# Patient Record
Sex: Female | Born: 1960 | Race: White | Hispanic: No | Marital: Married | Smoking: Never smoker
Health system: Southern US, Community
[De-identification: ages and names within clinical notes are randomized; demographics above are authoritative.]

## PROBLEM LIST (undated history)

## (undated) DIAGNOSIS — J189 Pneumonia, unspecified organism: Secondary | ICD-10-CM

## (undated) DIAGNOSIS — K219 Gastro-esophageal reflux disease without esophagitis: Secondary | ICD-10-CM

## (undated) DIAGNOSIS — Z9889 Other specified postprocedural states: Secondary | ICD-10-CM

## (undated) DIAGNOSIS — I10 Essential (primary) hypertension: Secondary | ICD-10-CM

## (undated) DIAGNOSIS — R112 Nausea with vomiting, unspecified: Secondary | ICD-10-CM

## (undated) DIAGNOSIS — E559 Vitamin D deficiency, unspecified: Secondary | ICD-10-CM

## (undated) DIAGNOSIS — R531 Weakness: Secondary | ICD-10-CM

## (undated) HISTORY — PX: ABDOMINAL HYSTERECTOMY: SHX81

## (undated) HISTORY — PX: COLONOSCOPY: SHX174

---

## 1998-01-13 ENCOUNTER — Ambulatory Visit (HOSPITAL_COMMUNITY): Admission: RE | Admit: 1998-01-13 | Discharge: 1998-01-13 | Payer: Self-pay | Admitting: Internal Medicine

## 1998-03-31 ENCOUNTER — Inpatient Hospital Stay (HOSPITAL_COMMUNITY): Admission: RE | Admit: 1998-03-31 | Discharge: 1998-04-02 | Payer: Self-pay | Admitting: Obstetrics and Gynecology

## 1998-11-21 HISTORY — PX: BREAST EXCISIONAL BIOPSY: SUR124

## 1999-03-03 ENCOUNTER — Other Ambulatory Visit: Admission: RE | Admit: 1999-03-03 | Discharge: 1999-03-03 | Payer: Self-pay | Admitting: Obstetrics and Gynecology

## 1999-03-11 ENCOUNTER — Encounter: Payer: Self-pay | Admitting: Obstetrics and Gynecology

## 1999-03-11 ENCOUNTER — Ambulatory Visit (HOSPITAL_COMMUNITY): Admission: RE | Admit: 1999-03-11 | Discharge: 1999-03-11 | Payer: Self-pay | Admitting: Obstetrics and Gynecology

## 1999-03-30 ENCOUNTER — Ambulatory Visit (HOSPITAL_COMMUNITY): Admission: RE | Admit: 1999-03-30 | Discharge: 1999-03-30 | Payer: Self-pay | Admitting: *Deleted

## 1999-06-14 ENCOUNTER — Encounter (INDEPENDENT_AMBULATORY_CARE_PROVIDER_SITE_OTHER): Payer: Self-pay | Admitting: Specialist

## 1999-06-14 ENCOUNTER — Other Ambulatory Visit: Admission: RE | Admit: 1999-06-14 | Discharge: 1999-06-14 | Payer: Self-pay | Admitting: Obstetrics and Gynecology

## 1999-08-23 ENCOUNTER — Other Ambulatory Visit: Admission: RE | Admit: 1999-08-23 | Discharge: 1999-08-23 | Payer: Self-pay | Admitting: Obstetrics and Gynecology

## 2000-04-18 ENCOUNTER — Other Ambulatory Visit: Admission: RE | Admit: 2000-04-18 | Discharge: 2000-04-18 | Payer: Self-pay | Admitting: Obstetrics and Gynecology

## 2000-08-24 ENCOUNTER — Other Ambulatory Visit: Admission: RE | Admit: 2000-08-24 | Discharge: 2000-08-24 | Payer: Self-pay | Admitting: Obstetrics and Gynecology

## 2001-02-27 ENCOUNTER — Ambulatory Visit (HOSPITAL_COMMUNITY): Admission: RE | Admit: 2001-02-27 | Discharge: 2001-02-27 | Payer: Self-pay | Admitting: Internal Medicine

## 2001-02-27 ENCOUNTER — Encounter: Payer: Self-pay | Admitting: Internal Medicine

## 2001-07-12 ENCOUNTER — Other Ambulatory Visit: Admission: RE | Admit: 2001-07-12 | Discharge: 2001-07-12 | Payer: Self-pay | Admitting: Obstetrics and Gynecology

## 2002-03-29 ENCOUNTER — Ambulatory Visit (HOSPITAL_COMMUNITY): Admission: RE | Admit: 2002-03-29 | Discharge: 2002-03-29 | Payer: Self-pay | Admitting: Internal Medicine

## 2002-07-18 ENCOUNTER — Other Ambulatory Visit: Admission: RE | Admit: 2002-07-18 | Discharge: 2002-07-18 | Payer: Self-pay | Admitting: Obstetrics and Gynecology

## 2003-05-12 ENCOUNTER — Encounter: Payer: Self-pay | Admitting: Internal Medicine

## 2003-05-12 ENCOUNTER — Ambulatory Visit (HOSPITAL_COMMUNITY): Admission: RE | Admit: 2003-05-12 | Discharge: 2003-05-12 | Payer: Self-pay | Admitting: Internal Medicine

## 2003-08-21 ENCOUNTER — Other Ambulatory Visit: Admission: RE | Admit: 2003-08-21 | Discharge: 2003-08-21 | Payer: Self-pay | Admitting: Obstetrics and Gynecology

## 2004-01-28 ENCOUNTER — Ambulatory Visit (HOSPITAL_BASED_OUTPATIENT_CLINIC_OR_DEPARTMENT_OTHER): Admission: RE | Admit: 2004-01-28 | Discharge: 2004-01-28 | Payer: Self-pay | Admitting: *Deleted

## 2004-01-28 ENCOUNTER — Ambulatory Visit (HOSPITAL_COMMUNITY): Admission: RE | Admit: 2004-01-28 | Discharge: 2004-01-28 | Payer: Self-pay | Admitting: *Deleted

## 2004-01-28 ENCOUNTER — Encounter (INDEPENDENT_AMBULATORY_CARE_PROVIDER_SITE_OTHER): Payer: Self-pay | Admitting: Specialist

## 2004-05-20 ENCOUNTER — Ambulatory Visit (HOSPITAL_COMMUNITY): Admission: RE | Admit: 2004-05-20 | Discharge: 2004-05-20 | Payer: Self-pay | Admitting: Internal Medicine

## 2004-09-07 ENCOUNTER — Other Ambulatory Visit: Admission: RE | Admit: 2004-09-07 | Discharge: 2004-09-07 | Payer: Self-pay | Admitting: Obstetrics and Gynecology

## 2005-09-22 ENCOUNTER — Other Ambulatory Visit: Admission: RE | Admit: 2005-09-22 | Discharge: 2005-09-22 | Payer: Self-pay | Admitting: Obstetrics and Gynecology

## 2005-10-09 ENCOUNTER — Encounter: Admission: RE | Admit: 2005-10-09 | Discharge: 2005-10-09 | Payer: Self-pay | Admitting: Internal Medicine

## 2006-02-27 ENCOUNTER — Ambulatory Visit (HOSPITAL_COMMUNITY): Admission: RE | Admit: 2006-02-27 | Discharge: 2006-02-27 | Payer: Self-pay | Admitting: Neurosurgery

## 2006-06-01 ENCOUNTER — Encounter: Admission: RE | Admit: 2006-06-01 | Discharge: 2006-06-01 | Payer: Self-pay | Admitting: Internal Medicine

## 2006-06-30 ENCOUNTER — Ambulatory Visit (HOSPITAL_COMMUNITY): Admission: RE | Admit: 2006-06-30 | Discharge: 2006-06-30 | Payer: Self-pay | Admitting: Internal Medicine

## 2006-09-25 ENCOUNTER — Other Ambulatory Visit: Admission: RE | Admit: 2006-09-25 | Discharge: 2006-09-25 | Payer: Self-pay | Admitting: Obstetrics and Gynecology

## 2007-07-06 ENCOUNTER — Ambulatory Visit (HOSPITAL_COMMUNITY): Admission: RE | Admit: 2007-07-06 | Discharge: 2007-07-06 | Payer: Self-pay | Admitting: Internal Medicine

## 2007-09-27 ENCOUNTER — Other Ambulatory Visit: Admission: RE | Admit: 2007-09-27 | Discharge: 2007-09-27 | Payer: Self-pay | Admitting: Obstetrics and Gynecology

## 2007-11-22 HISTORY — PX: GANGLION CYST EXCISION: SHX1691

## 2008-10-02 ENCOUNTER — Other Ambulatory Visit: Admission: RE | Admit: 2008-10-02 | Discharge: 2008-10-02 | Payer: Self-pay | Admitting: Obstetrics and Gynecology

## 2009-04-21 HISTORY — PX: OTHER SURGICAL HISTORY: SHX169

## 2009-04-28 ENCOUNTER — Ambulatory Visit (HOSPITAL_COMMUNITY): Admission: RE | Admit: 2009-04-28 | Discharge: 2009-04-28 | Payer: Self-pay | Admitting: Internal Medicine

## 2009-08-30 ENCOUNTER — Encounter: Admission: RE | Admit: 2009-08-30 | Discharge: 2009-08-30 | Payer: Self-pay | Admitting: Orthopedic Surgery

## 2009-10-06 ENCOUNTER — Other Ambulatory Visit: Admission: RE | Admit: 2009-10-06 | Discharge: 2009-10-06 | Payer: Self-pay | Admitting: Obstetrics and Gynecology

## 2010-05-17 ENCOUNTER — Ambulatory Visit (HOSPITAL_COMMUNITY): Admission: RE | Admit: 2010-05-17 | Discharge: 2010-05-17 | Payer: Self-pay | Admitting: Internal Medicine

## 2010-10-11 ENCOUNTER — Other Ambulatory Visit: Admission: RE | Admit: 2010-10-11 | Discharge: 2010-10-11 | Payer: Self-pay | Admitting: Obstetrics and Gynecology

## 2011-04-08 NOTE — Op Note (Signed)
NAME:  Paula Chase, Paula Chase                         ACCOUNT NO.:  [id number]   MEDICAL RECORD NO.:  [id number]                   PATIENT TYPE:  AMB   LOCATION:  DSC                                  FACILITY:  [facility]   PHYSICIAN:  Vikki Ports, M.D.         DATE OF BIRTH:  1961/02/14   DATE OF PROCEDURE:  01/28/2004  DATE OF DISCHARGE:                                 OPERATIVE REPORT   PREOPERATIVE DIAGNOSES:  Left breast mass.   POSTOPERATIVE DIAGNOSES:  Left breast mass.   OPERATION PERFORMED:  Excisional left breast biopsy.   SURGEON:  Vikki Ports, M.D.   ANESTHESIA:  MAC.   DESCRIPTION OF PROCEDURE:  The patient was taken to the operating room and  placed in supine position.  After adequate  anesthesia was induced, the left  breast was prepped and draped in the normal sterile fashion.  Using 1%  lidocaine with epinephrine, the skin and subcutaneous tissue overlying the  periareolar region of the left breast was anesthetized.  An incision was  made, a very superficial fibrous mass was identified and incised in its  entirety using Bovie electrocautery.  Adequate hemostasis was ensured and  the skin was closed with a subcuticular 4-0 Monocryl.  Steri-Strips and  sterile dressings were applied.  The patient was taken to PACU in good  condition.                                               Vikki Ports, M.D.    KR[facility]/MEDQ  D:  01/28/2004  T:  01/29/2004  Job:  [id number]

## 2011-08-11 ENCOUNTER — Other Ambulatory Visit: Payer: Self-pay | Admitting: Orthopedic Surgery

## 2011-08-11 DIAGNOSIS — M542 Cervicalgia: Secondary | ICD-10-CM

## 2011-08-18 ENCOUNTER — Ambulatory Visit
Admission: RE | Admit: 2011-08-18 | Discharge: 2011-08-18 | Disposition: A | Discharge status: Home / Self Care | Payer: 59 | Source: Ambulatory Visit | Attending: Orthopedic Surgery | Admitting: Orthopedic Surgery

## 2011-08-18 DIAGNOSIS — M542 Cervicalgia: Secondary | ICD-10-CM

## 2011-10-14 ENCOUNTER — Other Ambulatory Visit (HOSPITAL_COMMUNITY): Payer: Self-pay | Admitting: Internal Medicine

## 2011-10-14 DIAGNOSIS — Z1231 Encounter for screening mammogram for malignant neoplasm of breast: Secondary | ICD-10-CM

## 2011-11-01 ENCOUNTER — Encounter (HOSPITAL_COMMUNITY): Payer: Self-pay

## 2011-11-02 ENCOUNTER — Ambulatory Visit (HOSPITAL_COMMUNITY)
Admission: RE | Admit: 2011-11-02 | Discharge: 2011-11-02 | Disposition: A | Discharge status: Home / Self Care | Payer: 59 | Source: Ambulatory Visit | Attending: Internal Medicine | Admitting: Internal Medicine

## 2011-11-02 DIAGNOSIS — Z1231 Encounter for screening mammogram for malignant neoplasm of breast: Secondary | ICD-10-CM

## 2011-11-09 ENCOUNTER — Encounter (HOSPITAL_COMMUNITY)
Admission: RE | Admit: 2011-11-09 | Discharge: 2011-11-09 | Disposition: A | Discharge status: Home / Self Care | Payer: 59 | Source: Ambulatory Visit | Attending: Orthopedic Surgery | Admitting: Orthopedic Surgery

## 2011-11-09 ENCOUNTER — Encounter (HOSPITAL_COMMUNITY): Payer: Self-pay

## 2011-11-09 ENCOUNTER — Other Ambulatory Visit: Payer: Self-pay

## 2011-11-09 HISTORY — DX: Gastro-esophageal reflux disease without esophagitis: K21.9

## 2011-11-09 HISTORY — DX: Other specified postprocedural states: Z98.890

## 2011-11-09 HISTORY — DX: Nausea with vomiting, unspecified: R11.2

## 2011-11-09 LAB — SURGICAL PCR SCREEN
MRSA, PCR: NEGATIVE
Staphylococcus aureus: NEGATIVE

## 2011-11-09 LAB — BASIC METABOLIC PANEL
BUN: 17 mg/dL (ref 6–23)
Calcium: 9.6 mg/dL (ref 8.4–10.5)
GFR calc Af Amer: >90 mL/min (ref >90)
GFR calc non Af Amer: >90 mL/min (ref >90)
Potassium: 3.6 mEq/L (ref 3.5–5.1)

## 2011-11-09 LAB — CBC
Hemoglobin: 11.7 g/dL — ABNORMAL LOW (ref 12.0–15.0)
MCHC: 32.4 g/dL (ref 30.0–36.0)
Platelets: 273 10*3/uL (ref 150–400)
RDW: 14.7 % (ref 11.5–15.5)

## 2011-11-09 NOTE — Pre-Procedure Instructions (Signed)
[name]  11/09/2011   Your procedure is scheduled on:  11/17/11  Report to [facility] at 530 AM.  Call this number if you have problems the morning of surgery: [phone]   Remember:   Do not eat food:After Midnight.  May have clear liquids: up to 4 Hours before arrival.  Clear liquids include soda, tea, black coffee, apple or grape juice, broth.  Take these medicines the morning of surgery with A SIP OF WATER:albuterol, diltiazem, prevacid, oxycodone STOP  Asa herbal meds blood thinners   Do not wear jewelry, make-up or nail polish.  Do not wear lotions, powders, or perfumes. You may wear deodorant.  Do not shave 48 hours prior to surgery.  Do not bring valuables to the hospital.  Contacts, dentures or bridgework may not be worn into surgery.  Leave suitcase in the car. After surgery it may be brought to your room.  For patients admitted to the hospital, checkout time is 11:00 AM the day of discharge.   Patients discharged the day of surgery will not be allowed to drive home.  Name and phone number of your driver:phillip [phone]  Special Instructions: CHG Shower Use Special Wash: 1/2 bottle night before surgery and 1/2 bottle morning of surgery.   Please read over the following fact sheets that you were given: Pain Booklet, Coughing and Deep Breathing, Blood Transfusion Information, MRSA Information and Surgical Site Infection Prevention

## 2011-11-11 NOTE — H&P (Addendum)
Paula Chase 11/08/2011 9:13 AM Location: [facility] Patient #: [id number] DOB: July 15, 1961 Married / Language: [place] / Race: White Female   History of Present Illness(DIANA Dierdre Highman, PA-C; 11/08/2011 10:08 AM) The patient is a 50 year old female who comes in today for a preoperative History and Physical. The patient is scheduled for a ACDF C5-6 to be performed by Dr. Debria Garret D. Shon Baton, MD at [facility] on Thursday, November 17, 2011 at 7:30AM .    Problem List/Past Medical(DIANA Dierdre Highman, PA-C; 11/08/2011 9:26 AM) Cervical Disc Degeneration (722.4) Cervicalgia (723.1). 05/13/2011   Allergies(DIANA J KOVACH, PA-C; 11/08/2011 9:26 AM) SULFA DRUGS. 11/06/2007 Causes facial swelling   Family History(DIANA J KOVACH, PA-C; 11/08/2011 9:26 AM) Heart Disease. father Heart disease in female family member before age 34 Hypertension. mother, brother and grandmother mothers side   Social History(DIANA Dierdre Highman, PA-C; 11/08/2011 9:26 AM) Exercise. Exercises daily; does other Illicit drug use. no Living situation. live with spouse Copy of Drug/Alcohol Rehab (Previously). no Current work status. working full time Drug/Alcohol Rehab (Currently). no Marital status. married Number of flights of stairs before winded. greater than 5 Pain Contract. no Alcohol use. current drinker; drinks wine; only occasionally per week Tobacco use. never smoker Children. 0   Pregnancy / Birth History(DIANA J [facility], PA-C; 11/08/2011 9:26 AM) Pregnant. no   Past Surgical History(DIANA J [facility], PA-C; 11/08/2011 9:26 AM) Hysterectomy. complete (non-cancerous) Breast Biopsy. multiple times   Other Problems(DIANA J KOVACH, PA-C; 11/08/2011 9:26 AM) Unspecified Diagnosis High blood pressure Gastroesophageal Reflux Disease Asthma   Review of Systems(Lori W Lamb; 11/08/2011 10:12 AM) General:Not Present- Chills, Fever, Night Sweats, Appetite  Loss, Fatigue, Feeling sick, Weight Gain and Weight Loss. Skin:Not Present- Itching, Rash, Skin Color Changes, Ulcer, Psoriasis and Change in Hair or Nails. HEENT:Not Present- Sensitivity to light, Hearing problems, Nose Bleed and Ringing in the Ears. Neck:Not Present- Swollen Glands and Neck Mass. Respiratory:Not Present- Snoring, Chronic Cough, Bloody sputum and Dyspnea. Cardiovascular:Not Present- Shortness of Breath, Chest Pain, Swelling of Extremities, Leg Cramps and Palpitations. Gastrointestinal:Not Present- Bloody Stool, Heartburn, Abdominal Pain, Vomiting, Nausea and Incontinence of Stool. Female Genitourinary:Not Present- Blood in Urine, Menstrual Irregularities, Frequency, Incontinence and Nocturia. Musculoskeletal:Not Present- Muscle Weakness, Muscle Pain, Joint Stiffness, Joint Swelling, Joint Pain and Back Pain. Neurological:Not Present- Tingling, Numbness, Burning, Tremor, Headaches and Dizziness. Psychiatric:Not Present- Anxiety, Depression and Memory Loss. Endocrine:Not Present- Cold Intolerance, Heat Intolerance, Excessive hunger and Excessive Thirst. Hematology:Not Present- Abnormal Bleeding, Anemia, Blood Clots and Easy Bruising.   Vitals(Kathryn G Johnson; 11/08/2011 9:17 AM) 11/08/2011 9:14 AM Weight: 152 lb Height: 65 in Body Surface Area: 1.78 m Body Mass Index: 25.29 kg/m Pulse: 76 (Regular) BP: 133/85 (Sitting, Left Arm, Standard)    Physical Exam(DIANA J KOVACH, PA-C; 11/08/2011 10:08 AM) The physical exam findings are as follows:   General General Appearance- pleasant. Not in acute distress. Orientation- Oriented X3. Build & Nutrition- Well nourished and Well developed. Posture- Normal posture. Gait- Normal. Mental Status- Alert.   Integumentary General Characteristics:Surgical Scars- no surgical scar evidence of previous cervical surgery. Cervical Spine- Skin examination of the cervical spine is without deformity,  skin lesions, lacerations or abrasions.   Head and Neck Neck Global Assessment- supple. no lymphadenopathy and no nucchal rigidty.   Eye Pupil- Bilateral- Normal, Direct reaction to light normal, Equal and Regular. Motion- Bilateral- EOMI.   Chest and Lung Exam Auscultation: Breath sounds:- Clear.   Cardiovascular Auscultation:Rhythm- Regular rate and rhythm. Heart Sounds- Normal heart sounds.  Abdomen Palpation/Percussion:Palpation and Percussion of the abdomen reveal - Non Tender, No Rebound tenderness and Soft.   Peripheral Vascular Upper Extremity: Palpation:Radial pulse- Bilateral- 2+. Lower Extremity:Inspection- Bilateral- Inspection Normal. Palpation:Posterior tibial pulse- Bilateral- 2+. Dorsalis pedis pulse- Bilateral- 2+.   Neurologic Sensation:Upper Extremity- Bilateral- sensation is intact in the upper extremity. Reflexes:Biceps Reflex- Bilateral- 2+. Brachioradialis Reflex- Bilateral- 2+. Triceps Reflex- Bilateral- 2+. Patellar Reflex- Bilateral- 2+. Achilles Reflex- Bilateral- 2+. Babinski- Bilateral- Babinski not present. Clonus- Bilateral- clonus not present. Hoffman's Sign- Bilateral- Hoffman's sign not present.   Musculoskeletal Spine/Ribs/Pelvis Cervical Spine : Inspection and Palpation:Tenderness- no soft tissue tenderness to palpation and no bony tenderness to palpation. bony/soft tissue palpation of the cervical spine and shoulders does not recreate their typical pain. Strength and Tone: Strength:Deltoid- Bilateral- 5/5. Biceps- Bilateral- 5/5. Triceps- Bilateral- 5/5. Wrist Extension- Bilateral- 5/5. Hand Grip- Bilateral- 5/5. Heel walk- Bilateral- able to heel walk without difficulty. Toe Walk- Bilateral- able to walk on toes without difficulty. Heel-Toe Walk- Bilateral- able to heel-toe walk without difficulty. ROM- Flexion- Full. Extension- Full. Left Lateral Flexion -  Full. Right Lateral Flexion - Full. Left Rotation - Full. Right Rotation - Full. Pain:- neither flexion or extension is more painful than the other. Special Testing- axial compression test negative and cross chest impingement test negative. Non-Anatomic Signs- No non-anatomic signs present. Upper Extremity Range of Motion:- No truesholder pain with IR/ER of the shoulders.   Assessment & Plan(DIANA J KOVACH, PA-C; 11/08/2011 10:09 AM) Note: unfortunately conservatives measures consisting of observation, activity modification, oral pain medications and oral steroids have failed to alleviate her symptoms and because of the ongoing nature of her pain and the decrease in her quality of life, she wishes to proceed with surgical intervention.   MRI of the cervical spine done on 08/18/11 demonstrates a very large C5-6 disc extrusion with severe central stenosis and mild cord edema around the extrusion.   Risk/benefits/alternatives/expectations following surgery have been reviewed with the patient by Dr. Shon Baton. She is scheduled to complete her pre-op hospital requirements tomorrow. She has not been fitted for an ASPEN collar and I have informed her that our physical therapy department will most likely be contacting her to schedule a fitting. I have also informed her that she will need to bring that brace with her on the morning of surgery. She understands.   All of her questions were encouraged, addressed and answered. Plan, at this time is to proceed with surgery as scheduled.   Signed electronically by Gwinda Maine, PA-C (11/11/2011 1:58 PM)  Paula Chase 08/29/2011 8:25 AM Location[facility] Patient #: [id number] DOB: 02-28-1961 Married / Language: Undefined / Race: White Female   History of Present Illness(Lori Zipporah Plants; 08/29/2011 8:29 AM) The patient is a 50 year old female who presents today for follow up of their neck. The patient is being followed for  their central neck pain. Symptoms reported today include: pain (radiating into bilat. upper ext., worse on the left ), weakness and numbness (about the left upper ext.). The patient feels that they are doing poorly and report their pain level to be moderate to severe. Current treatment includes: home exercise program and pain medications (Neurotin 300 TID). The following medication has been used for pain control: none. The patient presents today following MRI (cervical at GI ).    Subjective Transcription(Nasim Garofano Sheela Stack, MD; 09/02/2011 4:29 PM)  She returns today for follow up of her MRI.    Allergies(Lori W Randa Lynn; 08/29/2011 8:29 AM)  SULFA DRUGS. 11/06/2007 Causes facial swelling   Social History(Lori W Randa Lynn; 08/29/2011 8:29 AM) Alcohol use. current drinker; drinks wine; only occasionally per week Tobacco use. never smoker   Medication History(Lori W Lamb; 08/29/2011 8:29 AM) Neurontin (300MG  Capsule, 1 Oral three times daily, Taken starting 08/09/2011) Active. (take one tablet at night for 1st 3 days on 4th day take 1 tablet in AM and 1 at night on day 7 take 1 AM, 1 lunch, 1 at night. ; D[facility] Dorris Singh)   Past Surgical History(Lori W Lamb; 08/29/2011 8:29 AM) Breast Biopsy. multiple times Hysterectomy. complete (non-cancerous)   Objective Transcription(Coleta Grosshans D Ceyda Peterka, MD; 09/02/2011 4:29 PM)  On clinical exam, she has a negative Babinski test, significant radicular arm pain and significant neck pain, loss of motion. Overall, her clinical exam is essentially unchanged from her previous visit.    Her MRI shows a very large disc herniation at C5-6. There is mild cervical cord edema at C5-6 associated with a cervical disc herniation, but it is causing severe central stenosis and radicular complaints.    Clinically, she does not have evidence of myelopathy. At this time we have discussed her MRI findings. We've discussed nonoperative  treatment vs. surgery.    Assessment & Plan(Sharon Gillian Shields; 08/29/2011 9:03 AM) Unspecified Diagnosis Current Plans l Started Percocet 5-325MG , 1-2 Tablet 1 po tid prn pain, #90, 30 days starting 08/29/2011, No Refill.   Plans Transcription(Selenia Mihok Sheela Stack, MD; 09/02/2011 4:29 PM)  At this point, she indicates she would like to proceed with surgery. We reviewed the risks of an anterior cervical discectomy and fusion which include infection, bleeding, nerve damage, death, stroke, paralysis, failure to heal, need for further surgery, ongoing or worse pain, loss of bowel or bladder control, throat pain, swallowing difficulties, hoarseness in the voice, need for further surgery, and adjacent segment degenerative disease. At this point, she states that, since she's been dealing with it for over 2 years, she would like to wait until December. I told her I think this would be fine. If anything changes, I would be happy to see her sooner. I will set her up for an anterior cervical discectomy and fusion. I've given her some research information literature to review.      Miscellaneous Transcription(Verner Mccrone Sheela Stack, MD; 09/02/2011 4:29 PM)  Venita Lick, M. 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

## 2011-11-17 ENCOUNTER — Encounter (HOSPITAL_COMMUNITY): Payer: Self-pay | Admitting: *Deleted

## 2011-11-17 ENCOUNTER — Encounter (HOSPITAL_COMMUNITY): Payer: Self-pay | Admitting: Vascular Surgery

## 2011-11-17 ENCOUNTER — Ambulatory Visit (HOSPITAL_COMMUNITY): Payer: 59

## 2011-11-17 ENCOUNTER — Encounter (HOSPITAL_COMMUNITY)
Admission: RE | Disposition: A | Discharge status: Home / Self Care | Payer: Self-pay | Source: Ambulatory Visit | Attending: Orthopedic Surgery

## 2011-11-17 ENCOUNTER — Ambulatory Visit (HOSPITAL_COMMUNITY)
Admission: RE | Admit: 2011-11-17 | Discharge: 2011-11-18 | Disposition: A | Discharge status: Home / Self Care | Payer: 59 | Source: Ambulatory Visit | Attending: Orthopedic Surgery | Admitting: Orthopedic Surgery

## 2011-11-17 ENCOUNTER — Ambulatory Visit (HOSPITAL_COMMUNITY): Payer: 59 | Admitting: Vascular Surgery

## 2011-11-17 DIAGNOSIS — Z01810 Encounter for preprocedural cardiovascular examination: Secondary | ICD-10-CM | POA: Insufficient documentation

## 2011-11-17 DIAGNOSIS — Z01812 Encounter for preprocedural laboratory examination: Secondary | ICD-10-CM | POA: Insufficient documentation

## 2011-11-17 DIAGNOSIS — K219 Gastro-esophageal reflux disease without esophagitis: Secondary | ICD-10-CM | POA: Insufficient documentation

## 2011-11-17 DIAGNOSIS — M5020 Other cervical disc displacement, unspecified cervical region: Secondary | ICD-10-CM | POA: Insufficient documentation

## 2011-11-17 DIAGNOSIS — I10 Essential (primary) hypertension: Secondary | ICD-10-CM | POA: Insufficient documentation

## 2011-11-17 DIAGNOSIS — Z01818 Encounter for other preprocedural examination: Secondary | ICD-10-CM | POA: Insufficient documentation

## 2011-11-17 DIAGNOSIS — J45909 Unspecified asthma, uncomplicated: Secondary | ICD-10-CM | POA: Insufficient documentation

## 2011-11-17 HISTORY — DX: Essential (primary) hypertension: I10

## 2011-11-17 HISTORY — PX: OTHER SURGICAL HISTORY: SHX169

## 2011-11-17 HISTORY — PX: ANTERIOR CERVICAL DECOMP/DISCECTOMY FUSION: SHX1161

## 2011-11-17 SURGERY — ANTERIOR CERVICAL DECOMPRESSION/DISCECTOMY FUSION 1 LEVEL
Anesthesia: General | Site: Spine Cervical | Wound class: Clean

## 2011-11-17 MED ORDER — NEOSTIGMINE METHYLSULFATE 1 MG/ML IJ SOLN
INTRAMUSCULAR | Status: DC | PRN
  Administered 2011-11-17: 3 mg via INTRAVENOUS

## 2011-11-17 MED ORDER — DEXAMETHASONE 4 MG PO TABS
4.0000 mg | ORAL_TABLET | Freq: Four times a day (QID) | ORAL | Status: DC
  Administered 2011-11-17 – 2011-11-18 (×5): 4 mg via ORAL
  Filled 2011-11-17 (×8): qty 1

## 2011-11-17 MED ORDER — CEFAZOLIN SODIUM 1-5 GM-% IV SOLN
1.0000 g | Freq: Three times a day (TID) | INTRAVENOUS | Status: DC
  Administered 2011-11-17: 1 g via INTRAVENOUS
  Filled 2011-11-17 (×3): qty 50

## 2011-11-17 MED ORDER — PROPOFOL 10 MG/ML IV EMUL
INTRAVENOUS | Status: DC | PRN
  Administered 2011-11-17: 160 mg via INTRAVENOUS

## 2011-11-17 MED ORDER — MENTHOL 3 MG MT LOZG
1.0000 | LOZENGE | OROMUCOSAL | Status: DC | PRN
  Administered 2011-11-18: 3 mg via ORAL
  Filled 2011-11-17: qty 9

## 2011-11-17 MED ORDER — METHOCARBAMOL 500 MG PO TABS
500.0000 mg | ORAL_TABLET | Freq: Four times a day (QID) | ORAL | Status: DC | PRN
  Administered 2011-11-18: 500 mg via ORAL
  Filled 2011-11-17: qty 1

## 2011-11-17 MED ORDER — HEMOSTATIC AGENTS (NO CHARGE) OPTIME
TOPICAL | Status: DC | PRN
  Administered 2011-11-17: 1 via TOPICAL

## 2011-11-17 MED ORDER — ACETAMINOPHEN 10 MG/ML IV SOLN: 1000.0000 mg | Freq: Once | INTRAVENOUS | Status: DC

## 2011-11-17 MED ORDER — SODIUM CHLORIDE 0.9 % IJ SOLN: 3.0000 mL | INTRAMUSCULAR | Status: DC | PRN

## 2011-11-17 MED ORDER — DILTIAZEM HCL ER 240 MG PO CP24
240.0000 mg | ORAL_CAPSULE | Freq: Every day | ORAL | Status: DC
  Filled 2011-11-17 (×2): qty 1

## 2011-11-17 MED ORDER — LACTATED RINGERS IV SOLN
INTRAVENOUS | Status: DC | PRN
  Administered 2011-11-17 (×2): via INTRAVENOUS

## 2011-11-17 MED ORDER — GLYCOPYRROLATE 0.2 MG/ML IJ SOLN
INTRAMUSCULAR | Status: DC | PRN
  Administered 2011-11-17: .4 mg via INTRAVENOUS

## 2011-11-17 MED ORDER — OXYCODONE HCL 5 MG PO TABS
10.0000 mg | ORAL_TABLET | ORAL | Status: DC | PRN
  Administered 2011-11-17 (×3): 5 mg via ORAL
  Administered 2011-11-18 (×2): 10 mg via ORAL
  Filled 2011-11-17: qty 2
  Filled 2011-11-17: qty 1
  Filled 2011-11-17 (×3): qty 2

## 2011-11-17 MED ORDER — CEFAZOLIN SODIUM 1-5 GM-% IV SOLN
INTRAVENOUS | Status: AC
  Administered 2011-11-17: 1 g via INTRAVENOUS
  Filled 2011-11-17: qty 50

## 2011-11-17 MED ORDER — MIDAZOLAM HCL 5 MG/5ML IJ SOLN
INTRAMUSCULAR | Status: DC | PRN
  Administered 2011-11-17: 2 mg via INTRAVENOUS

## 2011-11-17 MED ORDER — PANTOPRAZOLE SODIUM 20 MG PO TBEC
20.0000 mg | DELAYED_RELEASE_TABLET | Freq: Every day | ORAL | Status: DC
  Administered 2011-11-18: 20 mg via ORAL
  Filled 2011-11-17 (×2): qty 1

## 2011-11-17 MED ORDER — POTASSIUM CHLORIDE 10 MEQ PO TBCR
20.0000 meq | EXTENDED_RELEASE_TABLET | Freq: Every day | ORAL | Status: DC
  Administered 2011-11-17: 20 meq via ORAL
  Filled 2011-11-17 (×2): qty 2

## 2011-11-17 MED ORDER — SODIUM CHLORIDE 0.9 % IV SOLN: 250.0000 mL | INTRAVENOUS | Status: DC

## 2011-11-17 MED ORDER — ONDANSETRON HCL 4 MG/2ML IJ SOLN
INTRAMUSCULAR | Status: DC | PRN
  Administered 2011-11-17: 4 mg via INTRAVENOUS

## 2011-11-17 MED ORDER — CEFAZOLIN SODIUM 1-5 GM-% IV SOLN
INTRAVENOUS | Status: AC
  Filled 2011-11-17: qty 50

## 2011-11-17 MED ORDER — MEPERIDINE HCL 25 MG/ML IJ SOLN: 6.2500 mg | INTRAMUSCULAR | Status: DC | PRN

## 2011-11-17 MED ORDER — PROMETHAZINE HCL 25 MG/ML IJ SOLN
6.2500 mg | INTRAMUSCULAR | Status: DC | PRN
  Administered 2011-11-17: 12.5 mg via INTRAVENOUS

## 2011-11-17 MED ORDER — PHENOL 1.4 % MT LIQD: 1.0000 | OROMUCOSAL | Status: DC | PRN

## 2011-11-17 MED ORDER — DEXAMETHASONE SODIUM PHOSPHATE 4 MG/ML IJ SOLN: 4.0000 mg | Freq: Four times a day (QID) | INTRAMUSCULAR | Status: DC

## 2011-11-17 MED ORDER — LACTATED RINGERS IV SOLN
INTRAVENOUS | Status: DC
  Administered 2011-11-17: 100 mL via INTRAVENOUS
  Administered 2011-11-17: 21:00:00 via INTRAVENOUS

## 2011-11-17 MED ORDER — LACTATED RINGERS IV SOLN: INTRAVENOUS | Status: DC

## 2011-11-17 MED ORDER — CEFAZOLIN SODIUM 1-5 GM-% IV SOLN
1.0000 g | Freq: Three times a day (TID) | INTRAVENOUS | Status: AC
  Administered 2011-11-17 (×2): 1 g via INTRAVENOUS
  Filled 2011-11-17 (×2): qty 50

## 2011-11-17 MED ORDER — BUPIVACAINE-EPINEPHRINE 0.25% -1:200000 IJ SOLN
INTRAMUSCULAR | Status: DC | PRN
  Administered 2011-11-17: 2 mL

## 2011-11-17 MED ORDER — DEXAMETHASONE SODIUM PHOSPHATE 10 MG/ML IJ SOLN: 10.0000 mg | Freq: Once | INTRAMUSCULAR | Status: DC

## 2011-11-17 MED ORDER — DEXTROSE 5 % IV SOLN
INTRAVENOUS | Status: DC | PRN
  Administered 2011-11-17: 08:00:00 via INTRAVENOUS

## 2011-11-17 MED ORDER — MORPHINE SULFATE 4 MG/ML IJ SOLN
1.0000 mg | INTRAMUSCULAR | Status: DC | PRN
  Administered 2011-11-18: 4 mg via INTRAVENOUS
  Filled 2011-11-17: qty 1

## 2011-11-17 MED ORDER — DROPERIDOL 2.5 MG/ML IJ SOLN
INTRAMUSCULAR | Status: DC | PRN
  Administered 2011-11-17: 1.25 mg via INTRAVENOUS

## 2011-11-17 MED ORDER — ONDANSETRON HCL 4 MG/2ML IJ SOLN
4.0000 mg | INTRAMUSCULAR | Status: DC | PRN
  Administered 2011-11-18: 4 mg via INTRAVENOUS
  Filled 2011-11-17: qty 2

## 2011-11-17 MED ORDER — ZOLPIDEM TARTRATE 10 MG PO TABS: 10.0000 mg | ORAL_TABLET | Freq: Every evening | ORAL | Status: DC | PRN

## 2011-11-17 MED ORDER — SODIUM CHLORIDE 0.9 % IJ SOLN
3.0000 mL | Freq: Two times a day (BID) | INTRAMUSCULAR | Status: DC
  Administered 2011-11-18: 3 mL via INTRAVENOUS

## 2011-11-17 MED ORDER — VANCOMYCIN HCL IN DEXTROSE 1-5 GM/200ML-% IV SOLN: 1000.0000 mg | Freq: Once | INTRAVENOUS | Status: DC

## 2011-11-17 MED ORDER — ROCURONIUM BROMIDE 100 MG/10ML IV SOLN
INTRAVENOUS | Status: DC | PRN
  Administered 2011-11-17: 60 mg via INTRAVENOUS
  Administered 2011-11-17: 20 mg via INTRAVENOUS

## 2011-11-17 MED ORDER — LIDOCAINE HCL (CARDIAC) 20 MG/ML IV SOLN
INTRAVENOUS | Status: DC | PRN
  Administered 2011-11-17: 70 mg via INTRAVENOUS

## 2011-11-17 MED ORDER — ALBUTEROL SULFATE HFA 108 (90 BASE) MCG/ACT IN AERS
2.0000 | INHALATION_SPRAY | Freq: Four times a day (QID) | RESPIRATORY_TRACT | Status: DC | PRN
  Filled 2011-11-17: qty 6.7

## 2011-11-17 MED ORDER — 0.9 % SODIUM CHLORIDE (POUR BTL) OPTIME
TOPICAL | Status: DC | PRN
  Administered 2011-11-17 (×2): 1000 mL

## 2011-11-17 MED ORDER — DEXAMETHASONE SODIUM PHOSPHATE 4 MG/ML IJ SOLN
INTRAMUSCULAR | Status: DC | PRN
  Administered 2011-11-17: 8 mg via INTRAVENOUS

## 2011-11-17 MED ORDER — HYDROCHLOROTHIAZIDE 25 MG PO TABS
25.0000 mg | ORAL_TABLET | Freq: Every day | ORAL | Status: DC
  Administered 2011-11-17: 25 mg via ORAL
  Filled 2011-11-17 (×2): qty 1

## 2011-11-17 MED ORDER — FENTANYL CITRATE 0.05 MG/ML IJ SOLN
INTRAMUSCULAR | Status: DC | PRN
  Administered 2011-11-17: 50 ug via INTRAVENOUS
  Administered 2011-11-17: 150 ug via INTRAVENOUS
  Administered 2011-11-17: 50 ug via INTRAVENOUS

## 2011-11-17 MED ORDER — LISINOPRIL 10 MG PO TABS
10.0000 mg | ORAL_TABLET | Freq: Every day | ORAL | Status: DC
  Administered 2011-11-17: 10 mg via ORAL
  Filled 2011-11-17 (×2): qty 1

## 2011-11-17 MED ORDER — HYDROMORPHONE HCL PF 1 MG/ML IJ SOLN
0.2500 mg | INTRAMUSCULAR | Status: DC | PRN
  Administered 2011-11-17 (×3): 0.5 mg via INTRAVENOUS

## 2011-11-17 MED ORDER — ACETAMINOPHEN 10 MG/ML IV SOLN
1000.0000 mg | Freq: Four times a day (QID) | INTRAVENOUS | Status: AC
  Administered 2011-11-17 – 2011-11-18 (×4): 1000 mg via INTRAVENOUS
  Filled 2011-11-17 (×5): qty 100

## 2011-11-17 MED ORDER — METHOCARBAMOL 100 MG/ML IJ SOLN
500.0000 mg | Freq: Four times a day (QID) | INTRAVENOUS | Status: DC | PRN
  Administered 2011-11-17: 500 mg via INTRAVENOUS
  Filled 2011-11-17 (×2): qty 5

## 2011-11-17 MED ORDER — THROMBIN 5000 UNITS EX KIT
PACK | CUTANEOUS | Status: DC | PRN
  Administered 2011-11-17: 5000 [IU] via TOPICAL

## 2011-11-17 SURGICAL SUPPLY — 65 items
ADH SKN CLS APL DERMABOND .7 (GAUZE/BANDAGES/DRESSINGS)
BLADE SURG ROTATE 9660 (MISCELLANEOUS) IMPLANT
BUR EGG ELITE 4.0 (BURR) IMPLANT
BUR MATCHSTICK NEURO 3.0 LAGG (BURR) IMPLANT
CANISTER SUCTION 2500CC (MISCELLANEOUS) ×1 IMPLANT
CLOTH BEACON ORANGE TIMEOUT ST (SAFETY) ×2 IMPLANT
CORDS BIPOLAR (ELECTRODE) ×2 IMPLANT
COVER SURGICAL LIGHT HANDLE (MISCELLANEOUS) ×3 IMPLANT
CRADLE DONUT ADULT HEAD (MISCELLANEOUS) ×2 IMPLANT
DERMABOND ADVANCED (GAUZE/BANDAGES/DRESSINGS)
DERMABOND ADVANCED .7 DNX12 (GAUZE/BANDAGES/DRESSINGS) ×1 IMPLANT
DEVICE ENDSKLTN MED 6 7MM (Orthopedic Implant) IMPLANT
DISTRACTION PINS ×2 IMPLANT
DRAPE C-ARM 42X72 X-RAY (DRAPES) ×2 IMPLANT
DRAPE POUCH INSTRU U-SHP 10X18 (DRAPES) ×2 IMPLANT
DRAPE SURG 17X23 STRL (DRAPES) ×2 IMPLANT
DRAPE U-SHAPE 47X51 STRL (DRAPES) ×2 IMPLANT
DRSG MEPILEX BORDER 4X4 (GAUZE/BANDAGES/DRESSINGS) ×1 IMPLANT
DURAPREP 26ML APPLICATOR (WOUND CARE) ×2 IMPLANT
ELECT COATED BLADE 2.86 ST (ELECTRODE) ×2 IMPLANT
ELECT REM PT RETURN 9FT ADLT (ELECTROSURGICAL) ×2
ELECTRODE REM PT RTRN 9FT ADLT (ELECTROSURGICAL) ×1 IMPLANT
ENDOSKELETON MED 6 7MM (Orthopedic Implant) ×2 IMPLANT
GLOVE BIOGEL PI IND STRL 6.5 (GLOVE) ×1 IMPLANT
GLOVE BIOGEL PI IND STRL 8.5 (GLOVE) ×1 IMPLANT
GLOVE BIOGEL PI INDICATOR 6.5 (GLOVE) ×2
GLOVE BIOGEL PI INDICATOR 8.5 (GLOVE) ×1
GLOVE ECLIPSE 6.0 STRL STRAW (GLOVE) ×2 IMPLANT
GLOVE ECLIPSE 8.5 STRL (GLOVE) ×2 IMPLANT
GLOVE SS BIOGEL STRL SZ 6.5 (GLOVE) IMPLANT
GLOVE SUPERSENSE BIOGEL SZ 6.5 (GLOVE) ×2
GOWN PREVENTION PLUS XXLARGE (GOWN DISPOSABLE) ×2 IMPLANT
GOWN STRL NON-REIN LRG LVL3 (GOWN DISPOSABLE) ×4 IMPLANT
KIT BASIN OR (CUSTOM PROCEDURE TRAY) ×2 IMPLANT
KIT ROOM TURNOVER OR (KITS) ×2 IMPLANT
MANIFOLD NEPTUNE WASTE (CANNULA) ×1 IMPLANT
NDL SPNL 18GX3.5 QUINCKE PK (NEEDLE) ×1 IMPLANT
NEEDLE SPNL 18GX3.5 QUINCKE PK (NEEDLE) ×2 IMPLANT
NS IRRIG 1000ML POUR BTL (IV SOLUTION) ×3 IMPLANT
PACK ORTHO CERVICAL (CUSTOM PROCEDURE TRAY) ×2 IMPLANT
PACK UNIVERSAL I (CUSTOM PROCEDURE TRAY) ×2 IMPLANT
PAD ARMBOARD 7.5X6 YLW CONV (MISCELLANEOUS) ×3 IMPLANT
PIN DISTRACTOR STERILE 14MM (PIN) ×2 IMPLANT
PLATE LV 1 12MM (Plate) ×1 IMPLANT
PUTTY BONE DBX 2.5 MIS (Bone Implant) ×1 IMPLANT
SCREW 4.0X14MM (Screw) ×4 IMPLANT
SCREW 4.0X16MM (Screw) ×2 IMPLANT
SCREW BN 14X4XSLF DRL VA SLF (Screw) IMPLANT
SPONGE GAUZE 4X4 12PLY (GAUZE/BANDAGES/DRESSINGS) ×1 IMPLANT
SPONGE INTESTINAL PEANUT (DISPOSABLE) IMPLANT
SPONGE SURGIFOAM ABS GEL 100 (HEMOSTASIS) ×2 IMPLANT
STRIP CLOSURE SKIN 1/2X4 (GAUZE/BANDAGES/DRESSINGS) ×2 IMPLANT
SURGIFLO TRUKIT (HEMOSTASIS) ×1 IMPLANT
SUT MNCRL AB 3-0 PS2 18 (SUTURE) ×2 IMPLANT
SUT SILK 2 0 (SUTURE) ×2
SUT SILK 2-0 18XBRD TIE 12 (SUTURE) ×1 IMPLANT
SUT VIC AB 2-0 CT1 18 (SUTURE) ×2 IMPLANT
SYR BULB IRRIGATION 50ML (SYRINGE) ×2 IMPLANT
SYR CONTROL 10ML LL (SYRINGE) ×1 IMPLANT
TAPE CLOTH 4X10 WHT NS (GAUZE/BANDAGES/DRESSINGS) ×2 IMPLANT
TAPE PAPER 3X10 WHT MICROPORE (GAUZE/BANDAGES/DRESSINGS) ×1 IMPLANT
TAPE UMBILICAL COTTON 1/8X30 (MISCELLANEOUS) ×2 IMPLANT
TOWEL OR 17X24 6PK STRL BLUE (TOWEL DISPOSABLE) ×2 IMPLANT
TOWEL OR 17X26 10 PK STRL BLUE (TOWEL DISPOSABLE) ×2 IMPLANT
WATER STERILE IRR 1000ML POUR (IV SOLUTION) ×1 IMPLANT

## 2011-11-17 NOTE — Anesthesia Postprocedure Evaluation (Signed)
  Anesthesia Post-op Note  Patient: Paula Chase  Procedure(s) Performed:  ANTERIOR CERVICAL DECOMPRESSION/DISCECTOMY FUSION 1 LEVEL - carm, skytron table, synthes vector, titan cage  Patient Location: PACU  Anesthesia Type: General  Level of Consciousness: awake  Airway and Oxygen Therapy: Patient Spontanous Breathing and Patient connected to nasal cannula oxygen  Post-op Pain: mild  Post-op Assessment: Post-op Vital signs reviewed, Patient's Cardiovascular Status Stable, Respiratory Function Stable, Patent Airway and No signs of Nausea or vomiting  Post-op Vital Signs: Reviewed and stable  Complications: No apparent anesthesia complications

## 2011-11-17 NOTE — Op Note (Signed)
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 female who presented to my office with complaints of significant neck and radicular right arm pain. Clinical and radiographic analysis confirmed the diagnosis of significant central and right-sided disc herniation at C5-6. After long discussions the patient elected to wait ferny surgical decompression despite the pain and neurological deficits. She presents today for definitive surgical management of the disc herniation. Patient indicates that her pain is worsened but her overall clinical symptoms have not significantly changed. Again all risks and benefits of surgical intervention were reviewed with the patient and she consented to the aforementioned procedure.  PROCEDURE DETAILS: Patient was brought into the operating room. After successful induction of general anesthesia and tracheal intubation a Time Out was done. This confirmed all pertinent important data. A roll of towels were placed between the shoulder blades the shoulders and cells were taped down the anterior cervical spine was prepped and draped in a standard fashion.  X-ray was used to identify the inches his incision site and this was infiltrated with quarter percent Marcaine with epinephrine. A transverse incision was made centered over the C5-6 disc space. Sharp dissection was carried out down to the platysma. The platysma was sharply incised and a identified the medial border of the sternocleidomastoid and began dissecting sharply along the medial border into the deep cervical  and prevertebral fascia. Identified the Ommaya muscle and swept into to the right. I placed a finger bluntly dissecting down to I palpated the anterior cervical spine a thyroid retractor was used to protect the esophagus and trachea applied to the right side. Identified the carotid sheath and protected it with a finger. I now excellent visualization the anterior cervical spine. I then used Kitner dissectors to mobilize the remaining prevertebral fascia this to completely expose the cervical spine. A needle was placed into the C5-6 disc space and x-ray confirmed that I was at the appropriate level.  I then placed I then used a electrocautery to mobilize the longus coli muscles from the mid body of C5 to the midbody is bilateral I took this out laterally to the level the uncovertebral joint. I then placed a Caspar retracting blades underneath the longus coli muscle and deflated the endotracheal cuff. I spent retractors the appropriate width and then reinflated the endotracheal cuff.  Distraction pins were placed into the body of C5 and C6 and the space was distracted. An annulotomy was performed with a 15 blade scalpel and then using a combination of pituitary rongeurs curettes and rongeurs I removed the majority of the disc material at C5-6 the I then used a fine nerve hook to mobilize the disc herniation and remove it with the micropituitary rongeur. There was no CSF leak noted that there was significant compression of the thecal sac noted. Then used a 1 mm Kerrison to remove the remaining portion of the posterior longitudinal ligament. I then did decompressed underneath the uncovertebral joint removing bone spurs. At this point I could sweep underneath the is just at the vertebral bodies of C5 and C6 and confirm  that there was no further retained fragments of disc material. I also is able to sleep out underneath the uncovertebral joints bilaterally confirming that the nerve are Sills adequately decompressed the  at this point I rasp the endplates and then used FloSeal to obtain hemostasis. I then measured with trial devices and then placed the 7 mm lordotic titanium cage packed with DBX mix into the intervertebral space. X-rays were satisfactory. I then took a 12 mm anterior cervical Synthes Vectra plate secured with 16 mm screws into the body of C5 and 14 mm screws into the body of C6. All screws were torqued down and excellent purchase I then irrigated the wound copiously with normal saline and confirmed  hemostasis using bipolar electrocautery.  Trackers were removed the platysma was closed with interrupted 20 rectus sutures and the skin with 3-0 Monocryl. Steri-Strips dry dressing an Aspen collar were applied. The patient was then extubated transferred to PACU without incident. Into the case all middle sponge counts were correct there was no adverse intraoperative events.    Venita Lick, MD 11/17/2011 9:40 AM   \

## 2011-11-17 NOTE — Anesthesia Preprocedure Evaluation (Addendum)
Anesthesia Evaluation  Patient identified by MRN, date of birth, ID band Patient awake    Reviewed: Allergy & Precautions, H&P , NPO status , Patient's Chart, lab work & pertinent test results  History of Anesthesia Complications (+) PONV  Airway Mallampati: II TM Distance: >3 FB Neck ROM: full    Dental No notable dental hx. (+) Teeth Intact and Dental Advisory Given   Pulmonary neg pulmonary ROS,  clear to auscultation  Pulmonary exam normal       Cardiovascular Exercise Tolerance: Good hypertension, On Medications regular Normal    Neuro/Psych Negative Neurological ROS  Negative Psych ROS   GI/Hepatic Neg liver ROS, GERD-  Medicated and Controlled,  Endo/Other  Negative Endocrine ROS  Renal/GU negative Renal ROS  Genitourinary negative   Musculoskeletal   Abdominal   Peds  Hematology negative hematology ROS (+)   Anesthesia Other Findings   Reproductive/Obstetrics negative OB ROS                         Anesthesia Physical Anesthesia Plan  ASA: II  Anesthesia Plan: General   Post-op Pain Management:    Induction: Intravenous  Airway Management Planned: Oral ETT  Additional Equipment:   Intra-op Plan:   Post-operative Plan: Extubation in OR  Informed Consent: I have reviewed the patients History and Physical, chart, labs and discussed the procedure including the risks, benefits and alternatives for the proposed anesthesia with the patient or authorized representative who has indicated his/her understanding and acceptance.     Plan Discussed with: CRNA  Anesthesia Plan Comments:         Anesthesia Quick Evaluation

## 2011-11-17 NOTE — Preoperative (Signed)
Beta Blockers   Reason not to administer Beta Blockers:Not Applicable 

## 2011-11-17 NOTE — Transfer of Care (Signed)
Immediate Anesthesia Transfer of Care Note  Patient: Paula Chase  Procedure(s) Performed:  ANTERIOR CERVICAL DECOMPRESSION/DISCECTOMY FUSION 1 LEVEL - carm, skytron table, synthes vector, titan cage  Patient Location: PACU  Anesthesia Type: General  Level of Consciousness: awake, alert , oriented and patient cooperative  Airway & Oxygen Therapy: Patient Spontanous Breathing and Patient connected to nasal cannula oxygen  Post-op Assessment: Report given to PACU RN and Post -op Vital signs reviewed and stable  Post vital signs: Reviewed and stable  Complications: No apparent anesthesia complications

## 2011-11-17 NOTE — Anesthesia Procedure Notes (Signed)
Procedure Name: Intubation Date/Time: 11/17/2011 7:45 AM Performed by: Tyrone Nine Pre-anesthesia Checklist: Suction available, Patient being monitored, Emergency Drugs available and Patient identified Patient Re-evaluated:Patient Re-evaluated prior to inductionOxygen Delivery Method: Circle System Utilized Preoxygenation: Pre-oxygenation with 100% oxygen Intubation Type: IV induction Ventilation: Mask ventilation without difficulty Laryngoscope Size: Mac and 3 Grade View: Grade II Tube type: Oral Tube size: 7.5 mm Number of attempts: 1 Airway Equipment and Method: stylet Placement Confirmation: ETT inserted through vocal cords under direct vision,  breath sounds checked- equal and bilateral and positive ETCO2 Secured at: 22 cm Tube secured with: Tape Dental Injury: Teeth and Oropharynx as per pre-operative assessment

## 2011-11-18 ENCOUNTER — Encounter (HOSPITAL_COMMUNITY): Payer: Self-pay | Admitting: Orthopedic Surgery

## 2011-11-18 DIAGNOSIS — M5020 Other cervical disc displacement, unspecified cervical region: Secondary | ICD-10-CM

## 2011-11-18 MED ORDER — METHOCARBAMOL 500 MG PO TABS: 500.0000 mg | ORAL_TABLET | Freq: Three times a day (TID) | ORAL | Status: AC

## 2011-11-18 MED ORDER — POLYETHYLENE GLYCOL 3350 17 G PO PACK: 17.0000 g | PACK | Freq: Every day | ORAL | Status: AC

## 2011-11-18 MED ORDER — OXYCODONE-ACETAMINOPHEN 10-325 MG PO TABS: 1.0000 | ORAL_TABLET | Freq: Four times a day (QID) | ORAL | Status: AC | PRN

## 2011-11-18 MED ORDER — ONDANSETRON HCL 4 MG PO TABS: 4.0000 mg | ORAL_TABLET | Freq: Three times a day (TID) | ORAL | Status: AC | PRN

## 2011-11-18 MED FILL — Acetaminophen IV Soln 10 MG/ML: INTRAVENOUS | Qty: 100 | Status: AC

## 2011-11-18 NOTE — Discharge Summary (Signed)
Pt and husband given d/c instructions along with f/u apt to be made with Dr. Shon Baton in 2 weeks and prescriptions for percocet, robaxin, mirilax, and zofran. SL removed from L AC, catheter tip intact.  Pt verbalized understanding.  Steri-strips remain intact to L side of ant neck.  Personal belongings sent with pt.  Pt d/c'd via w/c accompanied by medical staff and husband to home.

## 2011-11-18 NOTE — Progress Notes (Signed)
OT consult received and appreciated. Pt. Currently D'Cing and with no OT needs. Will sign off acutely. Thanks-  Cassandria Anger, OTR/L Pager: [phone] 11/18/2011 .

## 2011-11-18 NOTE — Discharge Summary (Signed)
Patient ID: SHANIKQUA ZARZYCKI MRN: [id number] DOB/AGE: February 06, 1961 50 y.o.  Admit date: 11/17/2011 Discharge date: 11/18/2011  Admission Diagnoses:  Active Problems:  HNP (herniated nucleus pulposus), cervical   Discharge Diagnoses:  Same  Past Medical History  Diagnosis Date  . PONV (postoperative nausea and vomiting)   . GERD (gastroesophageal reflux disease)   . Asthma     " mild asthma "  . Hypertension     Surgeries: Procedure(s): ANTERIOR CERVICAL DECOMPRESSION/DISCECTOMY FUSION 1 LEVEL on 11/17/2011   Consultants:    Discharged Condition: Improved  Hospital Course: KERLY RIGSBEE is an 50 y.o. female who was admitted 11/17/2011 for operative treatment of HNP (herniated nucleus pulposus), cervical. Patient has severe unremitting pain that affects sleep, daily activities, and work/hobbies. After pre-op clearance the patient was taken to the operating room on 11/17/2011 and underwent  Procedure(s): ANTERIOR CERVICAL DECOMPRESSION/DISCECTOMY FUSION 1 LEVEL.    Patient was given perioperative antibiotics: Anti-infectives     Start     Dose/Rate Route Frequency Ordered Stop   11/17/11 1400   ceFAZolin (ANCEF) IVPB 1 g/50 mL premix        1 g 100 mL/hr over 30 Minutes Intravenous 3 times per day 11/17/11 1200 11/17/11 2300   11/17/11 1215   vancomycin (VANCOCIN) IVPB 1000 mg/200 mL premix  Status:  Discontinued     Comments: GIVE IF ALLERGIC TO PENICILLIN OR CEPHALOSPORINS      1,000 mg 200 mL/hr over 60 Minutes Intravenous  Once 11/17/11 1201 11/17/11 1311   11/17/11 0620   ceFAZolin (ANCEF) IVPB 1 g/50 mL premix  Status:  Discontinued     Comments: Give within 1 hour prop(if wt<or equel to 80 kg) if >80mg  give 2gm iv prop      1 g 100 mL/hr over 30 Minutes Intravenous 3 times per day 11/17/11 0620 11/17/11 2233   11/17/11 0559   ceFAZolin (ANCEF) 1-5 GM-% IVPB  Status:  Discontinued     Comments: Lahoma Rocker: cabinet override         11/17/11 0559 11/17/11 0615             Patient was given sequential compression devices and early ambulation to prevent DVT.  Patient benefited maximally from hospital stay and there were no complications.    Recent vital signs: Patient Vitals for the past 24 hrs:  BP Temp Temp src Pulse Resp SpO2  11/18/11 0500 107/65 mmHg 98 F (36.7 C) Oral 68  18  95 %  11/18/11 0200 104/68 mmHg 98.3 F (36.8 C) Oral 66  18  95 %  11/17/11 2100 106/64 mmHg 98.1 F (36.7 C) Oral 69  18  95 %  11/17/11 1827 118/72 mmHg 97.9 F (36.6 C) - 82  20  98 %  11/17/11 1701 108/68 mmHg 98.6 F (37 C) - 68  16  97 %  11/17/11 1420 120/77 mmHg 98 F (36.7 C) - 83  20  96 %  11/17/11 1221 112/71 mmHg 98.5 F (36.9 C) Oral 76  14  96 %  11/17/11 1115 - 97.9 F (36.6 C) - 85  11  99 %  11/17/11 1100 111/57 mmHg - - 67  10  98 %  11/17/11 1045 107/68 mmHg - - 67  10  100 %  11/17/11 1030 115/69 mmHg - - 61  7  100 %  11/17/11 1015 111/66 mmHg - - 64  11  99 %  11/17/11 1004 - 97.7 F (36.5 C) - - - -  Recent laboratory studies: No results found for this basename: WBC:2,HGB:2,HCT:2,PLT:2,NA:2,K:2,CL:2,CO2:2,BUN:2,CREATININE:2,GLUCOSE:2,PT:2,INR:2,CALCIUM,2: in the last 72 hours   Discharge Medications:  Current Discharge Medication List    START taking these medications   Details  methocarbamol (ROBAXIN) 500 MG tablet Take 1 tablet (500 mg total) by mouth 3 (three) times daily. Qty: 42 tablet, Refills: 0    ondansetron (ZOFRAN) 4 MG tablet Take 1 tablet (4 mg total) by mouth every 8 (eight) hours as needed for nausea. Qty: 20 tablet, Refills: 0    oxyCODONE-acetaminophen (PERCOCET) 10-325 MG per tablet Take 1 tablet by mouth every 6 (six) hours as needed for pain. Qty: 60 tablet, Refills: 0    polyethylene glycol (MIRALAX) packet Take 17 g by mouth daily. Qty: 14 each, Refills: 0      CONTINUE these medications which have NOT CHANGED   Details  Cholecalciferol (VITAMIN D3) 5000 UNITS TABS Take 1 tablet by mouth  daily.      diltiazem (DILACOR XR) 240 MG 24 hr capsule Take 240 mg by mouth daily.      hydrochlorothiazide (HYDRODIURIL) 25 MG tablet Take 25 mg by mouth daily.      Lansoprazole (PREVACID PO) Take 1 capsule by mouth daily.      lisinopril (PRINIVIL,ZESTRIL) 10 MG tablet Take 10 mg by mouth daily.      potassium chloride (K-DUR) 10 MEQ tablet Take 20 mEq by mouth daily.      albuterol (PROVENTIL HFA;VENTOLIN HFA) 108 (90 BASE) MCG/ACT inhaler Inhale 2 puffs into the lungs every 6 (six) hours as needed. For shortness of breath.       STOP taking these medications     oxyCODONE-acetaminophen (PERCOCET) 5-325 MG per tablet         Diagnostic Studies: Dg Chest 2 View  11/09/2011  *RADIOLOGY REPORT*  Clinical Data: Preoperative chest radiograph.  CHEST - 2 VIEW  Comparison: None.  Findings:  Cardiopericardial silhouette within normal limits. Mediastinal contours normal. Trachea midline.  No airspace disease or effusion.  IMPRESSION: No active cardiopulmonary disease.  Original Report Authenticated By: Andreas Newport, M.D.   Dg Cervical Spine 2-3 Views  11/17/2011  *RADIOLOGY REPORT*  Clinical Data: 50 year old female status post cervical spine surgery.  CERVICAL SPINE - 2-3 VIEW  Comparison: Intraoperative films from the same day and earlier.  Findings: Sequelae of C5-C6 ACDF with metallic interbody implant. Mild postoperative gas in the neck.  Prevertebral soft tissues within normal limits in this setting.  Stable vertebral height alignment elsewhere.  Stable disc spaces elsewhere.  Lung apices and C1-C2 alignment within normal limits.  IMPRESSION: C5-C6 ACDF without adverse features.  Original Report Authenticated By: Harley Hallmark, M.D.   Dg Cervical Spine 2-3 Views  11/17/2011  *RADIOLOGY REPORT*  Clinical Data: C5-6 ACDF.  CERVICAL SPINE - 2-3 VIEW  Comparison: Two-view cervical spine 11/09/2011.  Findings: Four intraoperative fluoro spot images are submitted. The first two images  demonstrate to anterior screws at C5 and C6. A probe projects over the C5-6 disc space.  The patient is intubated.  The third image demonstrates anterior plate and screw fixation.  A disc spacer is in place.  The fourth images is an anterior view of the same hardware.  IMPRESSION:  1.  Status post ACDF at C5-6 without radiographic evidence for complication.  Original Report Authenticated By: Jamesetta Orleans. MATTERN, M.D.   X-ray Cervical Spine Ap And Lateral  11/09/2011  *RADIOLOGY REPORT*  Clinical Data: Preoperative radiograph.  Cervical ACDF at C5-C6.  CERVICAL  SPINE - 2-3 VIEW  Comparison: None.  Findings: Near anatomic alignment cervical spine with 2 mm retrolisthesis of C5 on C6.  C5-C6 disc space loss is present. Prevertebral soft tissues are normal.  No fracture is identified. Craniocervical alignment normal.  Cervicothoracic junction normal.  IMPRESSION: C5-C6 spondylosis with disc space collapse and 2 mm retrolisthesis of C5 on C6.  Original Report Authenticated By: Andreas Newport, M.D.   Mm Digital Screening  11/08/2011  DG SCREEN MAMMOGRAM BILATERAL Bilateral CC and MLO view(s) were taken. Technologist: Zettie Pho, RT, RM Prior study comparison: July 06, 2007, [place] screen mammogram bilateral.  DIGITAL SCREENING MAMMOGRAM WITH CAD:  Comparison:  Prior studies.  The breast tissue is extremely dense.  There is no dominant mass, architectural distortion or  calcification to suggest malignancy.  Images were processed with CAD.  IMPRESSION:  No mammographic evidence of malignancy.  Suggest yearly screening mammography.  A result letter of this screening mammogram will be mailed directly to the patient.   ASSESSMENT: Negative - BI-RADS 1  Screening mammogram in 1 year. ,    Disposition:  STABLE Discharge Plan:  DC to home  Discharge Orders    Future Orders Please Complete By Expires   Diet - low sodium heart healthy      Call MD / Call 911      Comments:   If you experience chest pain or  shortness of breath, CALL 911 and be transported to the hospital emergency room.  If you develope a fever above 101 F, pus (white drainage) or increased drainage or redness at the wound, or calf pain, call your surgeon's office.   Constipation Prevention      Comments:   Drink plenty of fluids.  Prune juice may be helpful.  You may use a stool softener, such as Colace (over the counter) 100 mg twice a day.  Use MiraLax (over the counter) for constipation as needed.   Increase activity slowly as tolerated      Weight Bearing as taught in Physical Therapy      Comments:   Use a walker or crutches as instructed.   Discharge wound care:      Comments:   Keep incision clean and dry.  Change your bandage as instructed by your health care providers.  May shower starting on Tuesday, pat to dry following shower.  DO NOT put lotion or powder on your incision.      Follow-up Information    Follow up with BROOKS,DAHARI D in 2 weeks.   Contact information:   [facility] [address] [place] [address] [phone]           Signed: Gwinda Maine 11/18/2011, 8:10 AM

## 2011-11-18 NOTE — Progress Notes (Signed)
Physical Therapy Evaluation Patient Details Name: Paula Chase MRN: [id number] DOB: July 19, 1961 Today's Date: 11/18/2011  Problem List:  Patient Active Problem List  Diagnoses  . HNP (herniated nucleus pulposus), cervical    Past Medical History:  Past Medical History  Diagnosis Date  . PONV (postoperative nausea and vomiting)   . GERD (gastroesophageal reflux disease)   . Asthma     " mild asthma "  . Hypertension    Past Surgical History:  Past Surgical History  Procedure Date  . Abdominal hysterectomy   . [place] 06,10    release and 2nd transposition   . Ganglion cyst excision 09    lft wrist  . Cervival fusion 11/17/2011    PT Assessment/Plan/Recommendation PT Assessment Clinical Impression Statement: pt presents s/p ACDF and demos Independent with all mobility.  Reviewed donning/doffing Aspen Collar.  All education completed and no further PT needs addressed.   PT Recommendation/Assessment: Patent does not need any further PT services No Skilled PT: All education completed;Patient is independent with all acitivity/mobility;Patient will have necessary level of assist by caregiver at discharge PT Recommendation Follow Up Recommendations: None Equipment Recommended: None recommended by PT PT Goals     PT Evaluation Precautions/Restrictions  Precautions Precautions: Back Required Braces or Orthoses: Yes Cervical Brace: Hard collar;Applied in sitting position Restrictions Weight Bearing Restrictions: No Prior Functioning  Home Living Lives With: Spouse Receives Help From: Family Type of Home: House Home Layout: One level Home Access: Stairs to enter Entrance Stairs-Rails: None Entrance Stairs-Number of Steps: 1 Home Adaptive Equipment: None Prior Function Level of Independence: Independent with basic ADLs;Independent with homemaking with ambulation;Independent with gait;Independent with transfers Able to Take Stairs?: Reciprically Driving:  Yes Cognition Cognition Orientation Level: Oriented X4 Sensation/Coordination   Extremity Assessment RLE Assessment RLE Assessment: Within Functional Limits LLE Assessment LLE Assessment: Within Functional Limits Mobility (including Balance) Bed Mobility Bed Mobility: Yes Rolling Left: 7: Independent Left Sidelying to Sit: 7: Independent Sitting - Scoot to Edge of Bed: 7: Independent Sit to Supine - Left: 7: Independent Transfers Transfers: Yes Sit to Stand: 7: Independent;From bed;With upper extremity assist Stand to Sit: 7: Independent;With upper extremity assist;To bed Ambulation/Gait Ambulation/Gait: Yes Ambulation/Gait Assistance: 7: Independent Ambulation Distance (Feet): 180 Feet Assistive device: None Gait Pattern: Within Functional Limits Stairs: No Wheelchair Mobility Wheelchair Mobility: No    Exercise    End of Session PT - End of Session Equipment Utilized During Treatment: Cervical collar;Gait belt Activity Tolerance: Patient tolerated treatment well Patient left: in bed;with call bell in reach;with family/visitor present Nurse Communication: Mobility status for transfers;Mobility status for ambulation General Behavior During Session: [facility] for tasks performed Cognition: [facility] for tasks performed  Sunny Schlein, [place] [phone] 11/18/2011, 11:03 AM

## 2011-11-18 NOTE — Progress Notes (Signed)
KATEE WENTLAND 50 y.o. 11/17/2011   Lab. Results: No results found for this basename: WBC:2,HGB:2,HCT:2,PLT:2 in the last 72 hours BMET No results found for this basename: NA:2,K:2,CL:2,CO2:2,GLUCOSE:2,BUN:2,CREATININE:2,CALCIUM:2,CBG:2 in the last 72 hours  No results found for this basename: inr   VITALS Filed Vitals:   11/18/11 0500  BP: 107/65  Pulse: 68  Temp: 98 F (36.7 C)  Resp: 18     Subjective Patient doing well.  No radicular arm pain Objective NVI  Wound c/d/i Ambulating Voiding spontaneously No sob/cp tol diet xrays satisfactory  Assessment/ Plan Patient doing well Ok for d/c to home Instructions provided F/u 2 weeks   Keyandre Pileggi D 12/28/20127:59 AM

## 2012-01-02 ENCOUNTER — Ambulatory Visit
Admission: RE | Admit: 2012-01-02 | Discharge: 2012-01-02 | Disposition: A | Discharge status: Home / Self Care | Payer: 59 | Source: Ambulatory Visit | Attending: Orthopedic Surgery | Admitting: Orthopedic Surgery

## 2012-01-02 ENCOUNTER — Other Ambulatory Visit: Payer: Self-pay | Admitting: Orthopedic Surgery

## 2012-01-02 DIAGNOSIS — Z981 Arthrodesis status: Secondary | ICD-10-CM

## 2012-01-03 ENCOUNTER — Ambulatory Visit: Payer: 59 | Attending: Orthopedic Surgery | Admitting: Physical Therapy

## 2012-01-03 DIAGNOSIS — M542 Cervicalgia: Secondary | ICD-10-CM | POA: Insufficient documentation

## 2012-01-03 DIAGNOSIS — M2560 Stiffness of unspecified joint, not elsewhere classified: Secondary | ICD-10-CM | POA: Insufficient documentation

## 2012-01-03 DIAGNOSIS — IMO0001 Reserved for inherently not codable concepts without codable children: Secondary | ICD-10-CM | POA: Insufficient documentation

## 2012-01-03 DIAGNOSIS — M25539 Pain in unspecified wrist: Secondary | ICD-10-CM | POA: Insufficient documentation

## 2012-01-04 ENCOUNTER — Ambulatory Visit: Payer: 59 | Admitting: Physical Therapy

## 2012-01-05 ENCOUNTER — Ambulatory Visit: Payer: 59 | Admitting: Physical Therapy

## 2012-01-10 ENCOUNTER — Ambulatory Visit: Payer: 59 | Admitting: Physical Therapy

## 2012-01-13 ENCOUNTER — Ambulatory Visit: Payer: 59 | Admitting: Physical Therapy

## 2012-01-17 ENCOUNTER — Ambulatory Visit: Payer: 59 | Admitting: Physical Therapy

## 2012-01-19 ENCOUNTER — Ambulatory Visit: Payer: 59 | Admitting: Physical Therapy

## 2012-01-24 ENCOUNTER — Ambulatory Visit: Payer: 59 | Attending: Orthopedic Surgery | Admitting: Physical Therapy

## 2012-01-24 DIAGNOSIS — M542 Cervicalgia: Secondary | ICD-10-CM | POA: Insufficient documentation

## 2012-01-24 DIAGNOSIS — M2560 Stiffness of unspecified joint, not elsewhere classified: Secondary | ICD-10-CM | POA: Insufficient documentation

## 2012-01-24 DIAGNOSIS — M25539 Pain in unspecified wrist: Secondary | ICD-10-CM | POA: Insufficient documentation

## 2012-01-24 DIAGNOSIS — IMO0001 Reserved for inherently not codable concepts without codable children: Secondary | ICD-10-CM | POA: Insufficient documentation

## 2012-01-30 ENCOUNTER — Encounter: Payer: 59 | Admitting: Physical Therapy

## 2012-01-31 ENCOUNTER — Ambulatory Visit: Payer: 59 | Admitting: Physical Therapy

## 2012-01-31 ENCOUNTER — Encounter: Payer: 59 | Admitting: Physical Therapy

## 2012-02-07 ENCOUNTER — Encounter: Payer: 59 | Admitting: Physical Therapy

## 2012-02-13 ENCOUNTER — Other Ambulatory Visit: Payer: Self-pay | Admitting: Orthopedic Surgery

## 2012-02-13 ENCOUNTER — Ambulatory Visit
Admission: RE | Admit: 2012-02-13 | Discharge: 2012-02-13 | Disposition: A | Discharge status: Home / Self Care | Payer: 59 | Source: Ambulatory Visit | Attending: Orthopedic Surgery | Admitting: Orthopedic Surgery

## 2012-02-13 DIAGNOSIS — R52 Pain, unspecified: Secondary | ICD-10-CM

## 2012-03-21 ENCOUNTER — Other Ambulatory Visit: Payer: Self-pay | Admitting: Obstetrics and Gynecology

## 2012-03-21 DIAGNOSIS — N649 Disorder of breast, unspecified: Secondary | ICD-10-CM

## 2012-03-23 ENCOUNTER — Other Ambulatory Visit: Payer: Self-pay | Admitting: Obstetrics and Gynecology

## 2012-03-23 ENCOUNTER — Ambulatory Visit
Admission: RE | Admit: 2012-03-23 | Discharge: 2012-03-23 | Disposition: A | Discharge status: Home / Self Care | Payer: 59 | Source: Ambulatory Visit | Attending: Obstetrics and Gynecology | Admitting: Obstetrics and Gynecology

## 2012-03-23 DIAGNOSIS — N649 Disorder of breast, unspecified: Secondary | ICD-10-CM

## 2012-03-28 ENCOUNTER — Ambulatory Visit
Admission: RE | Admit: 2012-03-28 | Discharge: 2012-03-28 | Disposition: A | Discharge status: Home / Self Care | Payer: 59 | Source: Ambulatory Visit | Attending: Obstetrics and Gynecology | Admitting: Obstetrics and Gynecology

## 2012-03-28 DIAGNOSIS — N649 Disorder of breast, unspecified: Secondary | ICD-10-CM

## 2012-03-28 HISTORY — PX: BREAST BIOPSY: SHX20

## 2012-04-03 ENCOUNTER — Other Ambulatory Visit: Payer: 59

## 2012-09-10 ENCOUNTER — Other Ambulatory Visit: Payer: Self-pay | Admitting: Orthopedic Surgery

## 2012-09-10 ENCOUNTER — Ambulatory Visit (INDEPENDENT_AMBULATORY_CARE_PROVIDER_SITE_OTHER): Payer: 59

## 2012-09-10 DIAGNOSIS — Z981 Arthrodesis status: Secondary | ICD-10-CM

## 2012-12-12 ENCOUNTER — Other Ambulatory Visit: Payer: Self-pay | Admitting: Internal Medicine

## 2012-12-12 DIAGNOSIS — E041 Nontoxic single thyroid nodule: Secondary | ICD-10-CM

## 2012-12-18 ENCOUNTER — Ambulatory Visit
Admission: RE | Admit: 2012-12-18 | Discharge: 2012-12-18 | Disposition: A | Discharge status: Home / Self Care | Payer: 59 | Source: Ambulatory Visit | Attending: Internal Medicine | Admitting: Internal Medicine

## 2012-12-18 DIAGNOSIS — E041 Nontoxic single thyroid nodule: Secondary | ICD-10-CM

## 2013-05-03 ENCOUNTER — Other Ambulatory Visit (HOSPITAL_COMMUNITY): Payer: Self-pay | Admitting: Internal Medicine

## 2013-05-03 DIAGNOSIS — Z1231 Encounter for screening mammogram for malignant neoplasm of breast: Secondary | ICD-10-CM

## 2013-05-15 ENCOUNTER — Ambulatory Visit (HOSPITAL_COMMUNITY): Payer: 59

## 2013-05-29 ENCOUNTER — Ambulatory Visit (HOSPITAL_COMMUNITY)
Admission: RE | Admit: 2013-05-29 | Discharge: 2013-05-29 | Disposition: A | Discharge status: Home / Self Care | Payer: 59 | Source: Ambulatory Visit | Attending: Internal Medicine | Admitting: Internal Medicine

## 2013-05-29 DIAGNOSIS — Z1231 Encounter for screening mammogram for malignant neoplasm of breast: Secondary | ICD-10-CM

## 2014-04-28 ENCOUNTER — Ambulatory Visit (HOSPITAL_COMMUNITY)
Admission: RE | Admit: 2014-04-28 | Discharge: 2014-04-28 | Disposition: A | Discharge status: Home / Self Care | Payer: 59 | Source: Ambulatory Visit | Attending: Orthopedic Surgery | Admitting: Orthopedic Surgery

## 2014-04-28 ENCOUNTER — Encounter (HOSPITAL_COMMUNITY): Payer: Self-pay

## 2014-04-28 ENCOUNTER — Encounter (HOSPITAL_COMMUNITY)
Admission: RE | Admit: 2014-04-28 | Discharge: 2014-04-28 | Disposition: A | Discharge status: Home / Self Care | Payer: 59 | Source: Ambulatory Visit | Attending: Orthopedic Surgery | Admitting: Orthopedic Surgery

## 2014-04-28 ENCOUNTER — Ambulatory Visit (HOSPITAL_COMMUNITY)
Admission: RE | Admit: 2014-04-28 | Discharge: 2014-04-28 | Disposition: A | Discharge status: Home / Self Care | Payer: 59 | Source: Ambulatory Visit | Attending: Anesthesiology | Admitting: Anesthesiology

## 2014-04-28 DIAGNOSIS — M47812 Spondylosis without myelopathy or radiculopathy, cervical region: Secondary | ICD-10-CM | POA: Insufficient documentation

## 2014-04-28 DIAGNOSIS — Z01818 Encounter for other preprocedural examination: Secondary | ICD-10-CM | POA: Insufficient documentation

## 2014-04-28 DIAGNOSIS — Z01812 Encounter for preprocedural laboratory examination: Secondary | ICD-10-CM | POA: Insufficient documentation

## 2014-04-28 DIAGNOSIS — J45909 Unspecified asthma, uncomplicated: Secondary | ICD-10-CM | POA: Insufficient documentation

## 2014-04-28 HISTORY — DX: Pneumonia, unspecified organism: J18.9

## 2014-04-28 HISTORY — DX: Vitamin D deficiency, unspecified: E55.9

## 2014-04-28 HISTORY — DX: Weakness: R53.1

## 2014-04-28 LAB — BASIC METABOLIC PANEL
BUN: 13 mg/dL (ref 6–23)
CHLORIDE: 102 meq/L (ref 96–112)
CO2: 25 mEq/L (ref 19–32)
Calcium: 9.5 mg/dL (ref 8.4–10.5)
Creatinine, Ser: 0.76 mg/dL (ref 0.50–1.10)
GFR calc Af Amer: >90 mL/min (ref >90)
GFR calc non Af Amer: >90 mL/min (ref >90)
Glucose, Bld: 98 mg/dL (ref 70–99)
POTASSIUM: 3.4 meq/L — AB (ref 3.7–5.3)
SODIUM: 141 meq/L (ref 137–147)

## 2014-04-28 LAB — CBC
HEMATOCRIT: 35 % — AB (ref 36.0–46.0)
Hemoglobin: 11.4 g/dL — ABNORMAL LOW (ref 12.0–15.0)
MCH: 22.8 pg — ABNORMAL LOW (ref 26.0–34.0)
MCHC: 32.6 g/dL (ref 30.0–36.0)
MCV: 70 fL — ABNORMAL LOW (ref 78.0–100.0)
Platelets: 280 10*3/uL (ref 150–400)
RBC: 5 MIL/uL (ref 3.87–5.11)
RDW: 15.3 % (ref 11.5–15.5)
WBC: 6.8 10*3/uL (ref 4.0–10.5)

## 2014-04-28 LAB — SURGICAL PCR SCREEN
MRSA, PCR: NEGATIVE
Staphylococcus aureus: NEGATIVE

## 2014-04-28 NOTE — Pre-Procedure Instructions (Signed)
Paula Chase  04/28/2014   Your procedure is scheduled on:  Wed, June 17 @ 12:10 PM  Report to [facility] Entrance A  at 10:00 AM.  Call this number if you have problems the morning of surgery: [phone]   Remember:   Do not eat food or drink liquids after midnight.   Take these medicines the morning of surgery with A SIP OF WATER: Albuterol<Bring Your Inhaler With You>,Diltiazem(Dilacor),and Prevacid(Lansoprazole)                No Goody's,BC's,Aleve,Aspirin,Ibuprofen,Fish Oil,or any Herbal Medications   Do not wear jewelry  Do not wear lotions, powders, or colognes. You may wear deodorant.  Men may shave face and neck.  Do not bring valuables to the hospital.  [facility] is not responsible                  for any belongings or valuables.               Contacts, dentures or bridgework may not be worn into surgery.  Leave suitcase in the car. After surgery it may be brought to your room.  For patients admitted to the hospital, discharge time is determined by your                treatment team.               Patients discharged the day of surgery will not be allowed to drive  home.    Special Instructions:  [place] - Preparing for Surgery  Before surgery, you can play an important role.  Because skin is not sterile, your skin needs to be as free of germs as possible.  You can reduce the number of germs on you skin by washing with CHG (chlorahexidine gluconate) soap before surgery.  CHG is an antiseptic cleaner which kills germs and bonds with the skin to continue killing germs even after washing.  Please DO NOT use if you have an allergy to CHG or antibacterial soaps.  If your skin becomes reddened/irritated stop using the CHG and inform your nurse when you arrive at Short Stay.  Do not shave (including legs and underarms) for at least 48 hours prior to the first CHG shower.  You may shave your face.  Please follow these instructions carefully:   1.  Shower with CHG Soap  the night before surgery and the                                morning of Surgery.  2.  If you choose to wash your hair, wash your hair first as usual with your       normal shampoo.  3.  After you shampoo, rinse your hair and body thoroughly to remove the                      Shampoo.  4.  Use CHG as you would any other liquid soap.  You can apply chg directly       to the skin and wash gently with scrungie or a clean washcloth.  5.  Apply the CHG Soap to your body ONLY FROM THE NECK DOWN.        Do not use on open wounds or open sores.  Avoid contact with your eyes,       ears, mouth and genitals (private parts).  Wash  genitals (private parts)       with your normal soap.  6.  Wash thoroughly, paying special attention to the area where your surgery        will be performed.  7.  Thoroughly rinse your body with warm water from the neck down.  8.  DO NOT shower/wash with your normal soap after using and rinsing off       the CHG Soap.  9.  Pat yourself dry with a clean towel.            10.  Wear clean pajamas.            11.  Place clean sheets on your bed the night of your first shower and do not        sleep with pets.  Day of Surgery  Do not apply any lotions/deoderants the morning of surgery.  Please wear clean clothes to the hospital/surgery center.     Please read over the following fact sheets that you were given: Pain Booklet, Coughing and Deep Breathing, MRSA Information and Surgical Site Infection Prevention

## 2014-04-28 NOTE — Progress Notes (Signed)
Pt doesn't have a cardiologist   Denies ever having an echo/stress test/heart cath  Medical Md is Dr.Karrar Husain   EKG to be requested from Dr.Husain

## 2014-05-07 ENCOUNTER — Observation Stay (HOSPITAL_COMMUNITY)
Admission: RE | Admit: 2014-05-07 | Discharge: 2014-05-08 | Disposition: A | Discharge status: Home / Self Care | Payer: 59 | Source: Ambulatory Visit | Attending: Orthopedic Surgery | Admitting: Orthopedic Surgery

## 2014-05-07 ENCOUNTER — Encounter (HOSPITAL_COMMUNITY): Payer: 59 | Admitting: Critical Care Medicine

## 2014-05-07 ENCOUNTER — Encounter (HOSPITAL_COMMUNITY): Payer: Self-pay | Admitting: Critical Care Medicine

## 2014-05-07 ENCOUNTER — Observation Stay (HOSPITAL_COMMUNITY): Payer: 59

## 2014-05-07 ENCOUNTER — Ambulatory Visit (HOSPITAL_COMMUNITY): Payer: 59 | Admitting: Critical Care Medicine

## 2014-05-07 ENCOUNTER — Encounter (HOSPITAL_COMMUNITY)
Admission: RE | Disposition: A | Discharge status: Home / Self Care | Payer: Self-pay | Source: Ambulatory Visit | Attending: Orthopedic Surgery

## 2014-05-07 ENCOUNTER — Ambulatory Visit (HOSPITAL_COMMUNITY): Payer: 59

## 2014-05-07 DIAGNOSIS — G8929 Other chronic pain: Secondary | ICD-10-CM | POA: Insufficient documentation

## 2014-05-07 DIAGNOSIS — J45909 Unspecified asthma, uncomplicated: Secondary | ICD-10-CM | POA: Insufficient documentation

## 2014-05-07 DIAGNOSIS — M5020 Other cervical disc displacement, unspecified cervical region: Principal | ICD-10-CM | POA: Diagnosis present

## 2014-05-07 DIAGNOSIS — K219 Gastro-esophageal reflux disease without esophagitis: Secondary | ICD-10-CM | POA: Insufficient documentation

## 2014-05-07 DIAGNOSIS — Z981 Arthrodesis status: Secondary | ICD-10-CM | POA: Insufficient documentation

## 2014-05-07 DIAGNOSIS — I10 Essential (primary) hypertension: Secondary | ICD-10-CM | POA: Insufficient documentation

## 2014-05-07 HISTORY — PX: ANTERIOR CERVICAL DECOMP/DISCECTOMY FUSION: SHX1161

## 2014-05-07 SURGERY — ANTERIOR CERVICAL DECOMPRESSION/DISCECTOMY FUSION 1 LEVEL/HARDWARE REMOVAL
Anesthesia: General

## 2014-05-07 MED ORDER — MIDAZOLAM HCL 2 MG/2ML IJ SOLN
INTRAMUSCULAR | Status: AC
  Filled 2014-05-07: qty 2

## 2014-05-07 MED ORDER — DEXAMETHASONE SODIUM PHOSPHATE 4 MG/ML IJ SOLN: 4.0000 mg | Freq: Once | INTRAMUSCULAR | Status: DC

## 2014-05-07 MED ORDER — HYDROMORPHONE HCL PF 1 MG/ML IJ SOLN
0.2500 mg | INTRAMUSCULAR | Status: DC | PRN
  Administered 2014-05-07 (×4): 0.5 mg via INTRAVENOUS

## 2014-05-07 MED ORDER — CEFAZOLIN SODIUM 1-5 GM-% IV SOLN
1.0000 g | Freq: Three times a day (TID) | INTRAVENOUS | Status: AC
  Administered 2014-05-07 – 2014-05-08 (×2): 1 g via INTRAVENOUS
  Filled 2014-05-07 (×2): qty 50

## 2014-05-07 MED ORDER — OXYCODONE HCL 5 MG/5ML PO SOLN: 5.0000 mg | Freq: Once | ORAL | Status: DC | PRN

## 2014-05-07 MED ORDER — LACTATED RINGERS IV SOLN: INTRAVENOUS | Status: DC

## 2014-05-07 MED ORDER — MIDAZOLAM HCL 2 MG/2ML IJ SOLN: 0.5000 mg | Freq: Once | INTRAMUSCULAR | Status: DC | PRN

## 2014-05-07 MED ORDER — OXYCODONE HCL 5 MG PO TABS: 5.0000 mg | ORAL_TABLET | Freq: Once | ORAL | Status: DC | PRN

## 2014-05-07 MED ORDER — ONDANSETRON HCL 4 MG/2ML IJ SOLN
INTRAMUSCULAR | Status: DC | PRN
  Administered 2014-05-07: 4 mg via INTRAVENOUS

## 2014-05-07 MED ORDER — DEXAMETHASONE SODIUM PHOSPHATE 4 MG/ML IJ SOLN
INTRAMUSCULAR | Status: AC
  Filled 2014-05-07: qty 2

## 2014-05-07 MED ORDER — BUPIVACAINE-EPINEPHRINE 0.25% -1:200000 IJ SOLN
INTRAMUSCULAR | Status: DC | PRN
  Administered 2014-05-07: 3 mL

## 2014-05-07 MED ORDER — FENTANYL CITRATE 0.05 MG/ML IJ SOLN
INTRAMUSCULAR | Status: DC | PRN
  Administered 2014-05-07: 250 ug via INTRAVENOUS

## 2014-05-07 MED ORDER — OXYCODONE HCL 5 MG PO TABS
10.0000 mg | ORAL_TABLET | ORAL | Status: DC | PRN
  Administered 2014-05-07 – 2014-05-08 (×3): 10 mg via ORAL
  Filled 2014-05-07 (×3): qty 2

## 2014-05-07 MED ORDER — CEFAZOLIN SODIUM-DEXTROSE 2-3 GM-% IV SOLR
INTRAVENOUS | Status: AC
  Filled 2014-05-07: qty 50

## 2014-05-07 MED ORDER — SCOPOLAMINE 1 MG/3DAYS TD PT72: 1.0000 | MEDICATED_PATCH | TRANSDERMAL | Status: DC

## 2014-05-07 MED ORDER — ACETAMINOPHEN 10 MG/ML IV SOLN
1000.0000 mg | Freq: Four times a day (QID) | INTRAVENOUS | Status: DC
  Administered 2014-05-07: 1000 mg via INTRAVENOUS

## 2014-05-07 MED ORDER — DIPHENHYDRAMINE HCL 25 MG PO CAPS: 25.0000 mg | ORAL_CAPSULE | Freq: Four times a day (QID) | ORAL | Status: DC | PRN

## 2014-05-07 MED ORDER — METHOCARBAMOL 1000 MG/10ML IJ SOLN
500.0000 mg | Freq: Four times a day (QID) | INTRAVENOUS | Status: DC | PRN
  Filled 2014-05-07: qty 5

## 2014-05-07 MED ORDER — DOCUSATE SODIUM 100 MG PO CAPS
100.0000 mg | ORAL_CAPSULE | Freq: Two times a day (BID) | ORAL | Status: DC
  Administered 2014-05-07 – 2014-05-08 (×2): 100 mg via ORAL
  Filled 2014-05-07 (×2): qty 1

## 2014-05-07 MED ORDER — DEXAMETHASONE 4 MG PO TABS
4.0000 mg | ORAL_TABLET | Freq: Four times a day (QID) | ORAL | Status: DC
  Administered 2014-05-07 – 2014-05-08 (×4): 4 mg via ORAL
  Filled 2014-05-07 (×7): qty 1

## 2014-05-07 MED ORDER — GLYCOPYRROLATE 0.2 MG/ML IJ SOLN
INTRAMUSCULAR | Status: DC | PRN
  Administered 2014-05-07: 0.6 mg via INTRAVENOUS

## 2014-05-07 MED ORDER — MEPERIDINE HCL 25 MG/ML IJ SOLN: 6.2500 mg | INTRAMUSCULAR | Status: DC | PRN

## 2014-05-07 MED ORDER — HEMOSTATIC AGENTS (NO CHARGE) OPTIME
TOPICAL | Status: DC | PRN
  Administered 2014-05-07: 1 via TOPICAL

## 2014-05-07 MED ORDER — LIDOCAINE HCL (CARDIAC) 20 MG/ML IV SOLN
INTRAVENOUS | Status: DC | PRN
  Administered 2014-05-07: 20 mg via INTRAVENOUS

## 2014-05-07 MED ORDER — SODIUM CHLORIDE 0.9 % IV SOLN: 250.0000 mL | INTRAVENOUS | Status: DC

## 2014-05-07 MED ORDER — METHOCARBAMOL 500 MG PO TABS
500.0000 mg | ORAL_TABLET | Freq: Four times a day (QID) | ORAL | Status: DC | PRN
  Administered 2014-05-07 – 2014-05-08 (×2): 500 mg via ORAL
  Filled 2014-05-07: qty 1

## 2014-05-07 MED ORDER — LISINOPRIL 10 MG PO TABS
10.0000 mg | ORAL_TABLET | Freq: Every day | ORAL | Status: DC
  Administered 2014-05-07 – 2014-05-08 (×2): 10 mg via ORAL
  Filled 2014-05-07 (×2): qty 1

## 2014-05-07 MED ORDER — THROMBIN 20000 UNITS EX SOLR
CUTANEOUS | Status: AC
  Filled 2014-05-07: qty 20000

## 2014-05-07 MED ORDER — ROCURONIUM BROMIDE 50 MG/5ML IV SOLN
INTRAVENOUS | Status: AC
  Filled 2014-05-07: qty 1

## 2014-05-07 MED ORDER — PROMETHAZINE HCL 25 MG/ML IJ SOLN: 6.2500 mg | INTRAMUSCULAR | Status: DC | PRN

## 2014-05-07 MED ORDER — DEXAMETHASONE SODIUM PHOSPHATE 4 MG/ML IJ SOLN
8.0000 mg | Freq: Once | INTRAMUSCULAR | Status: AC
  Administered 2014-05-07: 8 mg via INTRAVENOUS

## 2014-05-07 MED ORDER — OXYCODONE HCL 5 MG PO TABS
ORAL_TABLET | ORAL | Status: AC
  Filled 2014-05-07: qty 2

## 2014-05-07 MED ORDER — HYDROMORPHONE HCL PF 1 MG/ML IJ SOLN
INTRAMUSCULAR | Status: AC
  Administered 2014-05-07: 0.5 mg via INTRAVENOUS
  Filled 2014-05-07: qty 1

## 2014-05-07 MED ORDER — MENTHOL 3 MG MT LOZG: 1.0000 | LOZENGE | OROMUCOSAL | Status: DC | PRN

## 2014-05-07 MED ORDER — DEXAMETHASONE SODIUM PHOSPHATE 4 MG/ML IJ SOLN
4.0000 mg | Freq: Four times a day (QID) | INTRAMUSCULAR | Status: DC
  Filled 2014-05-07 (×7): qty 1

## 2014-05-07 MED ORDER — MIDAZOLAM HCL 5 MG/5ML IJ SOLN
INTRAMUSCULAR | Status: DC | PRN
  Administered 2014-05-07: 2 mg via INTRAVENOUS

## 2014-05-07 MED ORDER — MORPHINE SULFATE 2 MG/ML IJ SOLN
1.0000 mg | INTRAMUSCULAR | Status: DC | PRN
  Administered 2014-05-08: 2 mg via INTRAVENOUS
  Filled 2014-05-07: qty 1

## 2014-05-07 MED ORDER — SODIUM CHLORIDE 0.9 % IJ SOLN: 3.0000 mL | INTRAMUSCULAR | Status: DC | PRN

## 2014-05-07 MED ORDER — FENTANYL CITRATE 0.05 MG/ML IJ SOLN
INTRAMUSCULAR | Status: AC
  Filled 2014-05-07: qty 5

## 2014-05-07 MED ORDER — SCOPOLAMINE 1 MG/3DAYS TD PT72
1.0000 | MEDICATED_PATCH | TRANSDERMAL | Status: AC
  Administered 2014-05-07: 1 via TRANSDERMAL

## 2014-05-07 MED ORDER — LACTATED RINGERS IV SOLN
INTRAVENOUS | Status: DC
  Administered 2014-05-07 (×2): via INTRAVENOUS

## 2014-05-07 MED ORDER — NEOSTIGMINE METHYLSULFATE 10 MG/10ML IV SOLN
INTRAVENOUS | Status: DC | PRN
  Administered 2014-05-07: 4 mg via INTRAVENOUS

## 2014-05-07 MED ORDER — LIDOCAINE HCL (CARDIAC) 20 MG/ML IV SOLN
INTRAVENOUS | Status: AC
  Filled 2014-05-07: qty 5

## 2014-05-07 MED ORDER — BUPIVACAINE-EPINEPHRINE (PF) 0.25% -1:200000 IJ SOLN
INTRAMUSCULAR | Status: AC
  Filled 2014-05-07: qty 30

## 2014-05-07 MED ORDER — THROMBIN 20000 UNITS EX SOLR
CUTANEOUS | Status: DC | PRN
  Administered 2014-05-07: 13:00:00 via TOPICAL

## 2014-05-07 MED ORDER — HYDROCHLOROTHIAZIDE 25 MG PO TABS
25.0000 mg | ORAL_TABLET | Freq: Every day | ORAL | Status: DC
  Administered 2014-05-07 – 2014-05-08 (×2): 25 mg via ORAL
  Filled 2014-05-07 (×2): qty 1

## 2014-05-07 MED ORDER — PROPOFOL 10 MG/ML IV BOLUS
INTRAVENOUS | Status: DC | PRN
  Administered 2014-05-07: 20 mg via INTRAVENOUS
  Administered 2014-05-07: 150 mg via INTRAVENOUS
  Administered 2014-05-07: 20 mg via INTRAVENOUS

## 2014-05-07 MED ORDER — CEFAZOLIN SODIUM-DEXTROSE 2-3 GM-% IV SOLR
2.0000 g | INTRAVENOUS | Status: AC
  Administered 2014-05-07: 2 g via INTRAVENOUS

## 2014-05-07 MED ORDER — PHENOL 1.4 % MT LIQD: 1.0000 | OROMUCOSAL | Status: DC | PRN

## 2014-05-07 MED ORDER — METHOCARBAMOL 500 MG PO TABS
ORAL_TABLET | ORAL | Status: AC
  Filled 2014-05-07: qty 1

## 2014-05-07 MED ORDER — MORPHINE SULFATE 4 MG/ML IJ SOLN
INTRAMUSCULAR | Status: AC
  Administered 2014-05-07: 2 mg
  Filled 2014-05-07: qty 1

## 2014-05-07 MED ORDER — 0.9 % SODIUM CHLORIDE (POUR BTL) OPTIME
TOPICAL | Status: DC | PRN
  Administered 2014-05-07: 1000 mL

## 2014-05-07 MED ORDER — ACETAMINOPHEN 10 MG/ML IV SOLN
INTRAVENOUS | Status: AC
  Filled 2014-05-07: qty 100

## 2014-05-07 MED ORDER — EPHEDRINE SULFATE 50 MG/ML IJ SOLN
INTRAMUSCULAR | Status: AC
  Filled 2014-05-07: qty 1

## 2014-05-07 MED ORDER — PHENYLEPHRINE HCL 10 MG/ML IJ SOLN
INTRAMUSCULAR | Status: DC | PRN
  Administered 2014-05-07 (×4): 40 ug via INTRAVENOUS

## 2014-05-07 MED ORDER — ALBUTEROL SULFATE (2.5 MG/3ML) 0.083% IN NEBU: 2.5000 mg | INHALATION_SOLUTION | Freq: Four times a day (QID) | RESPIRATORY_TRACT | Status: DC

## 2014-05-07 MED ORDER — ACETAMINOPHEN 10 MG/ML IV SOLN
1000.0000 mg | Freq: Four times a day (QID) | INTRAVENOUS | Status: DC
  Administered 2014-05-07 – 2014-05-08 (×3): 1000 mg via INTRAVENOUS
  Filled 2014-05-07 (×3): qty 100

## 2014-05-07 MED ORDER — ONDANSETRON HCL 4 MG/2ML IJ SOLN: 4.0000 mg | INTRAMUSCULAR | Status: DC | PRN

## 2014-05-07 MED ORDER — ROCURONIUM BROMIDE 100 MG/10ML IV SOLN
INTRAVENOUS | Status: DC | PRN
  Administered 2014-05-07: 40 mg via INTRAVENOUS

## 2014-05-07 MED ORDER — SODIUM CHLORIDE 0.9 % IJ SOLN
INTRAMUSCULAR | Status: AC
  Filled 2014-05-07: qty 10

## 2014-05-07 MED ORDER — DILTIAZEM HCL ER 240 MG PO CP24
240.0000 mg | ORAL_CAPSULE | Freq: Every day | ORAL | Status: DC
  Administered 2014-05-08: 240 mg via ORAL
  Filled 2014-05-07: qty 1

## 2014-05-07 MED ORDER — ARTIFICIAL TEARS OP OINT
TOPICAL_OINTMENT | OPHTHALMIC | Status: DC | PRN
  Administered 2014-05-07: 1 via OPHTHALMIC

## 2014-05-07 MED ORDER — SODIUM CHLORIDE 0.9 % IJ SOLN: 3.0000 mL | Freq: Two times a day (BID) | INTRAMUSCULAR | Status: DC

## 2014-05-07 MED ORDER — PHENYLEPHRINE 40 MCG/ML (10ML) SYRINGE FOR IV PUSH (FOR BLOOD PRESSURE SUPPORT)
PREFILLED_SYRINGE | INTRAVENOUS | Status: AC
  Filled 2014-05-07: qty 10

## 2014-05-07 SURGICAL SUPPLY — 66 items
APL SKNCLS STERI-STRIP NONHPOA (GAUZE/BANDAGES/DRESSINGS)
BENZOIN TINCTURE PRP APPL 2/3 (GAUZE/BANDAGES/DRESSINGS) IMPLANT
BLADE SURG ROTATE 9660 (MISCELLANEOUS) IMPLANT
BUR EGG ELITE 4.0 (BURR) IMPLANT
BUR EGG ELITE 4.0MM (BURR)
BUR MATCHSTICK NEURO 3.0 LAGG (BURR) ×2 IMPLANT
CANISTER SUCTION 2500CC (MISCELLANEOUS) ×3 IMPLANT
CLOSURE STERI-STRIP 1/2X4 (GAUZE/BANDAGES/DRESSINGS) ×1
CLSR STERI-STRIP ANTIMIC 1/2X4 (GAUZE/BANDAGES/DRESSINGS) ×2 IMPLANT
CONT SPEC 4OZ CLIKSEAL STRL BL (MISCELLANEOUS) ×2 IMPLANT
CORDS BIPOLAR (ELECTRODE) ×3 IMPLANT
COVER SURGICAL LIGHT HANDLE (MISCELLANEOUS) ×4 IMPLANT
CRADLE DONUT ADULT HEAD (MISCELLANEOUS) ×3 IMPLANT
DRAPE C-ARM 42X72 X-RAY (DRAPES) ×3 IMPLANT
DRAPE POUCH INSTRU U-SHP 10X18 (DRAPES) ×3 IMPLANT
DRAPE SURG 17X23 STRL (DRAPES) ×3 IMPLANT
DRAPE U-SHAPE 47X51 STRL (DRAPES) ×4 IMPLANT
DRSG MEPILEX BORDER 4X4 (GAUZE/BANDAGES/DRESSINGS) ×3 IMPLANT
ELECT COATED BLADE 2.86 ST (ELECTRODE) ×3 IMPLANT
ELECT PENCIL ROCKER SW 15FT (MISCELLANEOUS) ×3 IMPLANT
ELECT REM PT RETURN 9FT ADLT (ELECTROSURGICAL) ×3
ELECTRODE REM PT RTRN 9FT ADLT (ELECTROSURGICAL) ×1 IMPLANT
GLOVE BIOGEL PI IND STRL 8 (GLOVE) ×1 IMPLANT
GLOVE BIOGEL PI IND STRL 8.5 (GLOVE) ×1 IMPLANT
GLOVE BIOGEL PI INDICATOR 8 (GLOVE) ×2
GLOVE BIOGEL PI INDICATOR 8.5 (GLOVE) ×2
GLOVE ECLIPSE 8.5 STRL (GLOVE) ×6 IMPLANT
GLOVE ORTHO TXT STRL SZ7.5 (GLOVE) ×3 IMPLANT
GOWN STRL REUS W/ TWL XL LVL3 (GOWN DISPOSABLE) ×2 IMPLANT
GOWN STRL REUS W/TWL 2XL LVL3 (GOWN DISPOSABLE) ×6 IMPLANT
GOWN STRL REUS W/TWL XL LVL3 (GOWN DISPOSABLE) ×6
INTERLOCK LRDTC CRVCL VBR 8MM (Peek) IMPLANT
KIT BASIN OR (CUSTOM PROCEDURE TRAY) ×3 IMPLANT
KIT ROOM TURNOVER OR (KITS) ×3 IMPLANT
LORDOTIC CERVICAL VBR 8MM SM (Peek) ×3 IMPLANT
NDL SPNL 18GX3.5 QUINCKE PK (NEEDLE) ×1 IMPLANT
NEEDLE SPNL 18GX3.5 QUINCKE PK (NEEDLE) ×3 IMPLANT
NS IRRIG 1000ML POUR BTL (IV SOLUTION) ×3 IMPLANT
PACK ORTHO CERVICAL (CUSTOM PROCEDURE TRAY) ×3 IMPLANT
PACK UNIVERSAL I (CUSTOM PROCEDURE TRAY) ×3 IMPLANT
PAD ARMBOARD 7.5X6 YLW CONV (MISCELLANEOUS) ×6 IMPLANT
PATTIES SURGICAL .25X.25 (GAUZE/BANDAGES/DRESSINGS) IMPLANT
PATTIES SURGICAL .5 X.5 (GAUZE/BANDAGES/DRESSINGS) ×4 IMPLANT
PIN DISTRACTION 14 (PIN) ×2 IMPLANT
PIN RETAINER PRODISC 14 MM (PIN) ×2 IMPLANT
PLATE ONE LEVEL SKYLINE 14MM (Plate) ×4 IMPLANT
PUTTY BONE DBX 2.5 MIS (Bone Implant) ×2 IMPLANT
RESTRAINT LIMB HOLDER UNIV (RESTRAINTS) ×3 IMPLANT
SCREW SKYLINE 14MM SD-VA (Screw) ×2 IMPLANT
SCREW SKYLINE VAR OS 14MM (Screw) ×4 IMPLANT
SPONGE INTESTINAL PEANUT (DISPOSABLE) ×6 IMPLANT
SPONGE LAP 4X18 X RAY DECT (DISPOSABLE) IMPLANT
SPONGE SURGIFOAM ABS GEL 100 (HEMOSTASIS) ×3 IMPLANT
SURGIFLO TRUKIT (HEMOSTASIS) IMPLANT
SUT BONE WAX W31G (SUTURE) ×3 IMPLANT
SUT MON AB 3-0 SH 27 (SUTURE) ×3
SUT MON AB 3-0 SH27 (SUTURE) ×1 IMPLANT
SUT VIC AB 2-0 CT1 36 (SUTURE) ×3 IMPLANT
SYR CONTROL 10ML LL (SYRINGE) ×3 IMPLANT
TAPE CLOTH 4X10 WHT NS (GAUZE/BANDAGES/DRESSINGS) ×3 IMPLANT
TAPE UMBILICAL COTTON 1/8X30 (MISCELLANEOUS) ×6 IMPLANT
TOWEL NATURAL 6PK STERILE (DISPOSABLE) ×2 IMPLANT
TOWEL OR 17X24 6PK STRL BLUE (TOWEL DISPOSABLE) ×3 IMPLANT
TOWEL OR 17X26 10 PK STRL BLUE (TOWEL DISPOSABLE) ×3 IMPLANT
TRAY FOLEY CATH 16FRSI W/METER (SET/KITS/TRAYS/PACK) IMPLANT
WATER STERILE IRR 1000ML POUR (IV SOLUTION) ×3 IMPLANT

## 2014-05-07 NOTE — H&P (Signed)
History of Present Illness(Paula Chase; 03/26/2014 8:15 AM) The patient is a 53 year old female who presents with neck pain. The patient reports symptoms involving the left scapula pain that radiates down the posterior left arm to the hand (. She did have a cervical fusion by dr Shon BatonBrooks in 2012. She returns today to discuss her Cervical MRI scan) which began 3 week(s) ago. The symptoms began without any known injury. Symptoms include neck pain (only when recumbent and when lying on either side) and neck stiffness. The patient describes the pain as sharp, dull, aching, burning, stinging and throbbing.The patient describes their symptoms as moderate in severity.The patient does feel that the symptoms are unchanged. Symptoms are exacerbated by turning the head to the left, use of the left arm, neck flexion and neck extension. Current treatment includes opioid analgesics (Robaxin 500mg ). Prior to being seen today the patient was previously evaluated in this clinic. Past evaluation has included cervical spine [facility] The patient states that this is not a Financial risk analystWorker's Compensation case.  Paula Fastracey Chase returns today for followup. She continues to have significant neck and radiating left arm pain.    Allergies SULFA DRUGS. 11/06/2007 Causes facial swelling    Family History Heart Disease. Father. father Heart disease in female family member before age 53 Hypertension. mother, brother and grandmother mothers side    Social History Alcohol use. current drinker; drinks wine; only occasionally per week Children. 0 Copy of Drug/Alcohol Rehab (Previously). no Current work status. working full time Drug/Alcohol Rehab (Currently). no Exercise. Exercises daily; does other Illicit drug use. no Living situation. live with spouse Marital status. married Number of flights of stairs before winded. greater than 5 Pain Contract. no Tobacco use. Never  smoker. never smoker    Medication History Percocet (5-325MG  Tablet, 1 (one) Tablet Oral PO TID, Taken starting 03/21/2014) Active. (DDB/RCY) TraMADol HCl (50MG  Tablet, 1 (one) Tablet Tablet Oral tid prn, Taken starting 03/14/2014) Active. (RX GIVEN AT VISIT JMO/SMT 03/14/14) Robaxin (500MG  Tablet, 1 (one) Tablet Tablet Oral TID PRN, Taken starting 03/14/2014) Active. (RX SENT TO [facility] 03/14/14) Lisinopril (20MG  Tablet, Oral) Active. (qd ? mg) Hydrochlorothiazide ( Oral) Specific dose unknown - Active. (qd) Potassium Aminobenzoate ( Oral) Specific dose unknown - Active. (qd) PriLOSEC OTC (20MG  Tablet DR, Oral) Active. (qd) Diltiazem HCl ER Beads (240MG  Capsule ER 24HR, Oral) Active. (qd)    Pregnancy / Birth History Pregnant. no    Past Surgical History Breast Biopsy. multiple times Hysterectomy. complete (non-cancerous) Neck Surgery. ACDF C5-6    Other Problems Unspecified Diagnosis Asthma Gastroesophageal Reflux Disease High blood pressure    Objective Transcription  Clinically she has a positive Spurling sign, numbness and dysesthesias in the left C7 distribution. Negative Hoffman sign, negative Babinski sign. Normal gait pattern. She has no focal motor deficits in the upper extremity but she does have radicular pain again left side C7 distribution. No shoulder, elbow, wrist pain with joint range of motion.  Respiratory, no shortness of breath or chest pain.  Abdomen, soft and nontender.    IMAGES:  MRI from Mar 24, 2014, shows a new disk herniation at C6-7 with a 5-mm left posterolateral disk herniation resulting in severe left neuroforaminal stenosis and mass effect on the exiting C7 nerve root. No central stenosis. The surgical level is satisfactory. There is no cord signal change and there no significant central cord compression.   Plans Transcription  At this point in  time, given the C7 dysesthesias, numbness and  pain and the correlating MRI findings we have discussed injection therapy, physiotherapy and surgery. Given the size of the fragment I am not very confident that injection therapy would be of any benefit. After discussing with the patient, she would like to proceed with the ACDF. I think this is reasonable given the success of the previous surgery, given the correlation of her clinical findings, imaging studies and her subjective complaints. The risks include infection, bleeding, nerve damage, death, stroke, paralysis, failure to heal, need for further surgery, ongoing or worse pain, loss of bowel or bladder control, throat pain, swallowing difficulties, hoarseness in the voice, it does not fuse, need for further surgery, ongoing or worse pain. All of the patient's questions were addressed. We will plan on getting a preoperative ENT evaluation to determine whether or not I can approach from the contralateral side which will avoid a significant amount of scar tissue. We will plan on doing the exploration removal of hardware at the C5-6 level and then an ACDF at C6-7. All of the patient's questions were addressed.

## 2014-05-07 NOTE — Transfer of Care (Signed)
Immediate Anesthesia Transfer of Care Note  Patient: Lennox Pippinsracey S Adamec  Procedure(s) Performed: Procedure(s): ANTERIOR CERVICAL DECOMPRESSION/DISCECTOMY FUSION C6 - C7/EXPLORATION OF FUSION C5 - C6 AND REMOVAL OF HARDWARE C5 - C6  1 LEVEL (N/A)  Patient Location: PACU  Anesthesia Type:General  Level of Consciousness: awake, alert  and oriented  Airway & Oxygen Therapy: Patient Spontanous Breathing and Patient connected to nasal cannula oxygen  Post-op Assessment: Report given to PACU RN, Post -op Vital signs reviewed and stable and Patient moving all extremities X 4  Post vital signs: Reviewed and stable  Complications: No apparent anesthesia complications

## 2014-05-07 NOTE — OR Nursing (Signed)
Cervical implants sent to be decontaminated. Implants will be repackaged and available for return to patient.

## 2014-05-07 NOTE — Op Note (Signed)
[place]:  Chase, Paula Chase               ACCOUNT NO.:  [id number]  MEDICAL RECORD NO.:  [id number]  LOCATION:  [id number]                        FACILITY:  [facility]  PHYSICIAN:  Alvy Bealahari D Brooks, MD    DATE OF BIRTH:  10/08/1961  DATE OF PROCEDURE:  05/07/2014 DATE OF DISCHARGE:                              OPERATIVE REPORT   PREOPERATIVE DIAGNOSIS:  C6-7 disk herniation.  POSTOPERATIVE DIAGNOSIS:  C6-7 disk herniation.  OPERATIVE PROCEDURES: 1. Removal of the anterior cervical plate G9-5C5-6. 2. Exploration of fusion C5-6. 3. Anterior cervical diskectomy and fusion C6-7.  INSTRUMENTATION SYSTEM USED:  DePuy guideline anterior cervical plate affixed with a 14-mm rescue and regular screws and an 8 small Titan titanium intervertebral cage packed with DBX mix.  COMPLICATIONS:  None.  FIRST ASSISTANT:  Genene ChurnJames M. Denton Meekwens, P.A.  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  She has done well up until recently when she started experiencing severe pain in the neck and left arm.  Repeat MRI scans demonstrated a disk herniation at C6-7.  As a result of the new pathology and the failure to improve with conservative measures, we elected to proceed with surgery.  All appropriate risks, benefits, and alternatives were discussed with the patient and consent was obtained.  OPERATIVE NOTE:  The patient was brought to the operating room, placed supine on the operating table.  After successful induction of general anesthesia and endotracheal intubation, TEDs, SCDs were applied.  The anterior cervical spine was prepped and draped in a standard fashion. Time-out was taken to confirm the patient, procedure, and all other pertinent important data.  Once this was completed, a standard right side transverse Smith-Robinson approach was undertaken.  A transverse incision was made and centered over the C6-7 disk space.  Sharp dissection was  carried out down to the platysma.  The platysma was sharply incised.  I then dissected along the medial border of the sternocleidomastoid through the deep cervical and prevertebral fascia. I then bluntly dissected palpating the carotid sheath and protecting with a finger, and then gently sweeping the esophagus and trachea to the left.  I then identified the previous hardware placed at the previous surgical procedure.  I used Kittner dissectors to completely mobilize the overlying scar tissue.  Once this was mobilized, I then continued my dissection inferiorly to expose the C6-7 disk space.  Once the disk space was exposed,  I used a removal device to remove all 4 screws and the cervical plate.  I then identified the Titan cage that I placed there and palpated.  It was solidly fixed, it was not loose, and it was well fixed.  At this point, convinced that the fusion was solid, I proceeded with the ACDF at C6-7.  I mobilized the longus colli muscles out to the level of the uncovertebral joints using bipolar electrocautery and then I placed a self-retaining retractor, cast bar underneath the longus colli muscle and expanded the retractor.  I did deflate the endotracheal cuff to place the retractors and then reinflated after they were positioned.  I then performed an annulotomy with a #15 blade scalpel.  The bulk of the disk was removed using pituitary rongeurs.  I trimmed the overhanging osteophyte from the inferior aspect of C6 with a 2-mm Kerrison.  I then placed distraction pins into the body of C6 and C7 and gently distracted the intervertebral space.  I maintained the distraction with the pins. I then continued my dissection posteriorly.  Once I was down to the posterior annulus, I was able to remove the fragmented disk material consistent with a size of that seen on the preoperative MRI.  I then developed a plane using and nerve hook between the posterior and longitudinal ligament and  the thecal sac.  I then resected the posterior longitudinal ligament.  I was able to freely sweep on the left side under the uncovertebral joint with my nerve hook confirming that I had removed the disk fragment and that there was no further compression.  At this point, I was very pleased with the diskectomy and adequate resection and I did fine pathology consistent with what I saw on the MRI.  I then rasped the endplates and trialed the intervertebral spacers.  I obtained an 8 small Titan titanium cage, packed with DBX mix and malleted to the appropriate depth.  I then removed the distraction pins and applied a 14 mm anterior cervical plate and used rescue screws into the body of C6, which got excellent purchase and then regular 14 mm standard screws into the body of C7, also which got excellent purchase. The screws were then locked down according manufacturer's standards.  I irrigated the wound copiously with normal saline and made sure I had hemostasis using bipolar electrocautery.  The platysma was reapproximated with 2-0 Vicryl sutures and skin with 3-0 Monocryl. Steri-Strips and dry dressing were applied.  My assistant was Zonia KiefJames Owens, [place], he was instrumental in assisting with the approach, retraction, and visualization, suction, and closure.     Alvy Bealahari D Brooks, MD     DDB/MEDQ  D:  05/07/2014  T:  05/07/2014  Job:  [id number]

## 2014-05-07 NOTE — Brief Op Note (Signed)
05/07/2014  3:19 PM  PATIENT:  Paula Chase  53 y.o. female  PRE-OPERATIVE DIAGNOSIS:  C6 - C7 HNP  POST-OPERATIVE DIAGNOSIS:  C6 - C7 HNP  PROCEDURE:  Procedure(s): ANTERIOR CERVICAL DECOMPRESSION/DISCECTOMY FUSION C6 - C7/EXPLORATION OF FUSION C5 - C6 AND REMOVAL OF HARDWARE C5 - C6  1 LEVEL (N/A)  SURGEON:  Surgeon(s) and Role:    * Venita Lickahari Brooks, MD - Primary  PHYSICIAN ASSISTANT:   ASSISTANTS: Zonia KiefJames Owens   ANESTHESIA:   general  EBL:  Total I/O In: 1400 [I.V.:1400] Out: 50 [Blood:50]  BLOOD ADMINISTERED:none  DRAINS: none   LOCAL MEDICATIONS USED:  MARCAINE     SPECIMEN:  No Specimen  DISPOSITION OF SPECIMEN:  N/A  COUNTS:  YES  TOURNIQUET:  * No tourniquets in log *  DICTATION: .Other Dictation: Dictation Number [id number]  PLAN OF CARE: Admit for overnight observation  PATIENT DISPOSITION:  PACU - hemodynamically stable.

## 2014-05-07 NOTE — Anesthesia Preprocedure Evaluation (Addendum)
Anesthesia Evaluation  Patient identified by MRN, date of birth, ID band Patient awake    Reviewed: Allergy & Precautions, H&P , NPO status , Patient's Chart, lab work & pertinent test results  History of Anesthesia Complications (+) PONV and history of anesthetic complications  Airway Mallampati: I TM Distance: >3 FB Neck ROM: Full    Dental  (+) Dental Advisory Given, Teeth Intact   Pulmonary asthma (last inhaler needed a month ago) ,  breath sounds clear to auscultation  Pulmonary exam normal       Cardiovascular hypertension, Pt. on medications Rhythm:Regular Rate:Normal     Neuro/Psych Chronic neck pain    GI/Hepatic Neg liver ROS, GERD-  Medicated and Controlled,  Endo/Other  negative endocrine ROS  Renal/GU negative Renal ROS     Musculoskeletal   Abdominal   Peds  Hematology negative hematology ROS (+) Blood dyscrasia (Hb 11.4), anemia ,   Anesthesia Other Findings   Reproductive/Obstetrics                         Anesthesia Physical Anesthesia Plan  ASA: II  Anesthesia Plan: General   Post-op Pain Management:    Induction: Intravenous  Airway Management Planned: Oral ETT and Video Laryngoscope Planned  Additional Equipment:   Intra-op Plan:   Post-operative Plan: Extubation in OR  Informed Consent: I have reviewed the patients History and Physical, chart, labs and discussed the procedure including the risks, benefits and alternatives for the proposed anesthesia with the patient or authorized representative who has indicated his/her understanding and acceptance.   Dental advisory given  Plan Discussed with: CRNA and Surgeon  Anesthesia Plan Comments: (Plan routine monitors, GETA with VideoGlide intubation)        Anesthesia Quick Evaluation

## 2014-05-07 NOTE — Anesthesia Procedure Notes (Signed)
Procedure Name: Intubation Date/Time: 05/07/2014 11:59 AM Performed by: Elon AlasLEE, Dustin Burrill BROWN Pre-anesthesia Checklist: Patient identified, Timeout performed, Emergency Drugs available, Suction available and Patient being monitored Patient Re-evaluated:Patient Re-evaluated prior to inductionOxygen Delivery Method: Circle system utilized Preoxygenation: Pre-oxygenation with 100% oxygen Intubation Type: IV induction Ventilation: Mask ventilation without difficulty Grade View: Grade I Tube type: Oral Tube size: 7.5 mm Number of attempts: 1 Airway Equipment and Method: Stylet and Video-laryngoscopy Placement Confirmation: CO2 detector,  positive ETCO2,  ETT inserted through vocal cords under direct vision and breath sounds checked- equal and bilateral Secured at: 21 cm Tube secured with: Tape Dental Injury: Teeth and Oropharynx as per pre-operative assessment  Comments: Elective glidescope  - pt complained of significant pain with extension

## 2014-05-08 MED ORDER — DOCUSATE SODIUM 100 MG PO CAPS: 100.0000 mg | ORAL_CAPSULE | Freq: Two times a day (BID) | ORAL | Status: AC

## 2014-05-08 MED ORDER — POLYETHYLENE GLYCOL 3350 17 G PO PACK: 17.0000 g | PACK | Freq: Every day | ORAL | Status: AC

## 2014-05-08 MED ORDER — METHOCARBAMOL 500 MG PO TABS: 500.0000 mg | ORAL_TABLET | Freq: Four times a day (QID) | ORAL | Status: AC | PRN

## 2014-05-08 MED ORDER — ALBUTEROL SULFATE (2.5 MG/3ML) 0.083% IN NEBU: 2.5000 mg | INHALATION_SOLUTION | Freq: Four times a day (QID) | RESPIRATORY_TRACT | Status: DC | PRN

## 2014-05-08 MED ORDER — OXYCODONE-ACETAMINOPHEN 10-325 MG PO TABS: 1.0000 | ORAL_TABLET | Freq: Four times a day (QID) | ORAL | Status: AC | PRN

## 2014-05-08 MED ORDER — ONDANSETRON 4 MG PO TBDP: 4.0000 mg | ORAL_TABLET | Freq: Three times a day (TID) | ORAL | Status: AC | PRN

## 2014-05-08 NOTE — Anesthesia Postprocedure Evaluation (Signed)
  Anesthesia Post-op Note  Patient: Paula Chase  Procedure(s) Performed: Procedure(s): ANTERIOR CERVICAL DECOMPRESSION/DISCECTOMY FUSION C6 - C7/EXPLORATION OF FUSION C5 - C6 AND REMOVAL OF HARDWARE C5 - C6  1 LEVEL (N/A)  Patient Location: PACU  Anesthesia Type:General  Level of Consciousness: awake  Airway and Oxygen Therapy: Patient Spontanous Breathing  Post-op Pain: mild  Post-op Assessment: Post-op Vital signs reviewed, Patient's Cardiovascular Status Stable, Respiratory Function Stable and Pain level controlled  Post-op Vital Signs: Reviewed and stable  Last Vitals:  Filed Vitals:   05/08/14 1004  BP: 100/56  Pulse:   Temp:   Resp:     Complications: No apparent anesthesia complications

## 2014-05-08 NOTE — Evaluation (Signed)
Occupational Therapy Evaluation and Discharge Patient Details Name: Paula Chase MRN: [id number] DOB: 11/10/1961 Today's Date: 05/08/2014    History of Present Illness Removal of the anterior cervical plate I6-9C5-6. Anterior cervical diskectomy and fusion C6-7.   Clinical Impression   This 53 yo female admitted and underwent above presents to acute OT with all education completed with her and her husband. Acute OT will sign off. No PT needs noted and they were made aware.    Follow Up Recommendations  No OT follow up    Equipment Recommendations  None recommended by OT       Precautions / Restrictions Precautions Precautions: Cervical Precaution Booklet Issued: Yes (comment) Required Braces or Orthoses: Cervical Brace Cervical Brace: Hard collar Restrictions Weight Bearing Restrictions: No      Mobility Bed Mobility Overal bed mobility: Modified Independent                Transfers Overall transfer level: Independent                    Balance Overall balance assessment: Modified Independent (increased time due to cervical surgery)                                          ADL Overall ADL's : Modified independent                                                       Pertinent Vitals/Pain 1/10; none needed     Hand Dominance Right   Extremity/Trunk Assessment Upper Extremity Assessment Upper Extremity Assessment: Overall WFL for tasks assessed (mild decreaseds sensation for light touch still present per pt as was prior to surgery)           Communication Communication Communication: No difficulties   Cognition Arousal/Alertness: Awake/alert Behavior During Therapy: WFL for tasks assessed/performed Overall Cognitive Status: Within Functional Limits for tasks assessed                     General Comments               Home Living Family/patient expects to be discharged to:: Private  residence Living Arrangements: Spouse/significant other Available Help at Discharge: Family Type of Home: House       Home Layout: One level     Bathroom Shower/Tub: Chief Strategy OfficerTub/shower unit   Bathroom Toilet: Standard                Prior Functioning/Environment Level of Independence: Independent        Comments: works full time             Leisure centre managerT Goals(Current goals can be found in the care plan section) Acute Rehab OT Goals Patient Stated Goal: Home today  OT Frequency:                End of Session Equipment Utilized During Treatment: Cervical collar Nurse Communication:  (new pads ordered for cervical collar)  Activity Tolerance: Patient tolerated treatment well Patient left: in bed;with call bell/phone within reach;with family/visitor present   Time: [id number] OT Time Calculation (min): 19 min Charges:  OT General Charges $OT Visit: 1 Procedure OT Evaluation $Initial OT Evaluation Tier  I: 1 Procedure OT Treatments $Self Care/Home Management : 8-22 mins  [name], [name] [phone] 05/08/2014, 10:24 AM

## 2014-05-08 NOTE — Progress Notes (Signed)
PT Cancellation Note  Patient Details Name: Paula Chase MRN: [id number] DOB: 07/02/1961   Cancelled Treatment:    Reason Eval/Treat Not Completed: PT screened, no needs identified, will sign off.  Per OT pt moving independently and no PT needs at this time.  Will sign off.     Ritenour, Alison MurrayMegan F 05/08/2014, 10:42 AM

## 2014-05-08 NOTE — Progress Notes (Signed)
CSW (Clinical Child psychotherapistocial Worker) aware of consult. At this time, MD note indicates pt will dc home today. Pt has no social work needs. Please reconsult should needs arise.  Poonum Ambelal, LCSWA [phone]

## 2014-05-08 NOTE — Progress Notes (Signed)
UR Completed Brenda Graves-Bigelow, RN,BSN [phone]  

## 2014-05-08 NOTE — Discharge Instructions (Signed)

## 2014-05-08 NOTE — Progress Notes (Signed)
    Subjective: Procedure(s) (LRB): ANTERIOR CERVICAL DECOMPRESSION/DISCECTOMY FUSION C6 - C7/EXPLORATION OF FUSION C5 - C6 AND REMOVAL OF HARDWARE C5 - C6  1 LEVEL (N/A) 1 Day Post-Op  Patient reports pain as 1 on 0-10 scale.  Reports decreased arm pain reports incisional neck pain   Positive void Negative bowel movement Positive flatus Negative chest pain or shortness of breath  Objective: Vital signs in last 24 hours: Temp:  [97.6 F (36.4 C)-98.3 F (36.8 C)] 97.7 F (36.5 C) (06/18 0550) Pulse Rate:  [53-97] 63 (06/18 0550) Resp:  [10-20] 11 (06/17 1645) BP: (105-125)/(53-76) 105/57 mmHg (06/18 0550) SpO2:  [98 %-100 %] 100 % (06/18 0550) Weight:  [156 lb (70.761 kg)] 156 lb (70.761 kg) (06/17 1016)  Intake/Output from previous day: 06/17 0701 - 06/18 0700 In: 2030 [P.O.:120; I.V.:1910] Out: 50 [Blood:50]  Labs: No results found for this basename: WBC, RBC, HCT, PLT,  in the last 72 hours No results found for this basename: NA, K, CL, CO2, BUN, CREATININE, GLUCOSE, CALCIUM,  in the last 72 hours No results found for this basename: LABPT, INR,  in the last 72 hours  Physical Exam: Neurologically intact ABD soft Intact pulses distally Incision: dressing C/D/I Compartment soft no swelling, hematoma noted at incision site.  Assessment/Plan: Patient stable  xrays satisfactory   Mobilization with physical therapy Encourage incentive spirometry Continue care  Advance diet Up with therapy D/C IV fluids D/C to home today  Venita Lickahari Brooks, MD [facility] [phone]

## 2014-05-09 NOTE — Care Management Note (Signed)
CARE MANAGEMENT NOTE 05/09/2014  Patient:  Lennox PippinsRKER,Kaho S   Account Number:  [id number]  Date Initiated:  05/09/2014  Documentation initiated by:  Vance PeperBRADY,Harvis Mabus  Subjective/Objective Assessment:   53 yr old female s/p ACDF C6-7, exploration of fusion C5-C6, removal of Hardware C5-6     Action/Plan:   Patient seen by PT/OT staff, no home health needs identified. No further case management needs.   Anticipated DC Date:  05/08/2014   Anticipated DC Plan:  HOME/SELF CARE      DC Planning Services  CM consult      Choice offered to / List presented to:             Status of service:  Completed, signed off Medicare Important Message given?   (If response is "NO", the following Medicare IM given date fields will be blank) Date Medicare IM given:   Date Additional Medicare IM given:    Discharge Disposition:  HOME/SELF CARE  Per UR Regulation:    If discussed at Long Length of Stay Meetings, dates discussed:    Comments:

## 2014-05-11 ENCOUNTER — Encounter (HOSPITAL_COMMUNITY): Payer: Self-pay | Admitting: Orthopedic Surgery

## 2014-05-29 ENCOUNTER — Encounter (HOSPITAL_COMMUNITY): Payer: Self-pay | Admitting: Orthopedic Surgery

## 2014-05-29 NOTE — OR Nursing (Signed)
Late entry on 05-29-2014 by D. Ashli Selders, RN to add surgery end time. 

## 2014-06-09 NOTE — Discharge Summary (Signed)
Patient ID: Paula Chase MRN: [id number] DOB/AGE: 53/04/1961 53 y.o.  Admit date: 05/07/2014 Discharge date: 06/09/2014  Admission Diagnoses:  Active Problems:   Cervical disc herniation   Discharge Diagnoses:  Active Problems:   Cervical disc herniation  status post Procedure(s): ANTERIOR CERVICAL DECOMPRESSION/DISCECTOMY FUSION C6 - C7/EXPLORATION OF FUSION C5 - C6 AND REMOVAL OF HARDWARE C5 - C6  1 LEVEL  Past Medical History  Diagnosis Date  . PONV (postoperative nausea and vomiting)   . Hypertension     takes Lisinopril,Diltiazem,and HCTZ daily  . Asthma     " mild asthma "-uses Albuterol daily as needed  . Pneumonia     as a child  . Weakness     numbness or tingling  . GERD (gastroesophageal reflux disease)     takes Omeprazole daily  . Vitamin D deficiency     takes Vit D     Surgeries: Procedure(s): ANTERIOR CERVICAL DECOMPRESSION/DISCECTOMY FUSION C6 - C7/EXPLORATION OF FUSION C5 - C6 AND REMOVAL OF HARDWARE C5 - C6  1 LEVEL on 05/07/2014   Consultants:    Discharged Condition: Improved  Hospital Course: Paula Chase is an 53 y.o. female who was admitted 05/07/2014 for operative treatment of cervical stenosis and retained hardware.  Patient failed conservative treatments (please see the history and physical for the specifics) and had severe unremitting pain that affects sleep, daily activities and work/hobbies. After pre-op clearance, the patient was taken to the operating room on 05/07/2014 and underwent  Procedure(s): ANTERIOR CERVICAL DECOMPRESSION/DISCECTOMY FUSION C6 - C7/EXPLORATION OF FUSION C5 - C6 AND REMOVAL OF HARDWARE C5 - C6  1 LEVEL.    Patient was given perioperative antibiotics:  Anti-infectives   Start     Dose/Rate Route Frequency Ordered Stop   05/07/14 2000  ceFAZolin (ANCEF) IVPB 1 g/50 mL premix     1 g 100 mL/hr over 30 Minutes Intravenous Every 8 hours 05/07/14 1841 05/08/14 0457   05/07/14 1027  ceFAZolin (ANCEF) 2-3 GM-% IVPB  SOLR    Comments:  Alferd ApaFaries, Bridget   : cabinet override      05/07/14 1027 05/07/14 2244   05/07/14 1024  ceFAZolin (ANCEF) IVPB 2 g/50 mL premix     2 g 100 mL/hr over 30 Minutes Intravenous 30 min pre-op 05/07/14 1024 05/07/14 1202       Patient was given sequential compression devices and early ambulation to prevent DVT.   Patient benefited maximally from hospital stay and there were no complications. At the time of discharge, the patient was urinating/moving their bowels without difficulty, tolerating a regular diet, pain is controlled with oral pain medications and they have been cleared by PT/OT.   Recent vital signs: No data found.    Recent laboratory studies: No results found for this basename: WBC, HGB, HCT, PLT, NA, K, CL, CO2, BUN, CREATININE, GLUCOSE, PT, INR, CALCIUM, 2,  in the last 72 hours   Discharge Medications:     Medication List    STOP taking these medications       HYDROcodone-acetaminophen 5-325 MG per tablet  Commonly known as:  NORCO/VICODIN      TAKE these medications       albuterol 108 (90 BASE) MCG/ACT inhaler  Commonly known as:  PROVENTIL HFA;VENTOLIN HFA  Inhale 2 puffs into the lungs every 6 (six) hours as needed. For shortness of breath.     diltiazem 240 MG 24 hr capsule  Commonly known as:  DILACOR XR  Take 240 mg by mouth daily.     docusate sodium 100 MG capsule  Commonly known as:  COLACE  Take 1 capsule (100 mg total) by mouth 2 (two) times daily.     hydrochlorothiazide 25 MG tablet  Commonly known as:  HYDRODIURIL  Take 25 mg by mouth daily.     lisinopril 10 MG tablet  Commonly known as:  PRINIVIL,ZESTRIL  Take 10 mg by mouth daily.     methocarbamol 500 MG tablet  Commonly known as:  ROBAXIN  Take 1 tablet (500 mg total) by mouth every 6 (six) hours as needed for muscle spasms.     omeprazole 20 MG tablet  Commonly known as:  PRILOSEC OTC  Take 20 mg by mouth daily.     ondansetron 4 MG disintegrating tablet    Commonly known as:  ZOFRAN ODT  Take 1 tablet (4 mg total) by mouth every 8 (eight) hours as needed.     oxyCODONE-acetaminophen 10-325 MG per tablet  Commonly known as:  PERCOCET  Take 1 tablet by mouth every 6 (six) hours as needed for pain.     polyethylene glycol packet  Commonly known as:  MIRALAX / GLYCOLAX  Take 17 g by mouth daily.     potassium chloride 10 MEQ tablet  Commonly known as:  K-DUR  Take 20 mEq by mouth daily.     Vitamin D3 5000 UNITS Tabs  Take 1 tablet by mouth daily.        Diagnostic Studies: No results found.        Follow-up Information   Schedule an appointment as soon as possible for a visit with Alvy Beal, MD. (needs return office visit 2 weeks postop)    Specialty:  Orthopedic Surgery   Contact information:   [address] [place] [address] [phone]       Discharge Plan:  discharge to home   Disposition:     Signed: Venita Lick D for Dr. Venita Lick [facility] [phone] 06/09/2014, 4:20 PM

## 2015-01-28 ENCOUNTER — Other Ambulatory Visit (HOSPITAL_COMMUNITY): Payer: Self-pay | Admitting: Internal Medicine

## 2015-01-28 DIAGNOSIS — Z1231 Encounter for screening mammogram for malignant neoplasm of breast: Secondary | ICD-10-CM

## 2015-02-06 ENCOUNTER — Ambulatory Visit (HOSPITAL_COMMUNITY)
Admission: RE | Admit: 2015-02-06 | Discharge: 2015-02-06 | Disposition: A | Discharge status: Home / Self Care | Payer: 59 | Source: Ambulatory Visit | Attending: Internal Medicine | Admitting: Internal Medicine

## 2015-02-06 DIAGNOSIS — Z1231 Encounter for screening mammogram for malignant neoplasm of breast: Secondary | ICD-10-CM | POA: Insufficient documentation

## 2016-01-05 ENCOUNTER — Other Ambulatory Visit: Payer: Self-pay

## 2016-01-05 DIAGNOSIS — Z1231 Encounter for screening mammogram for malignant neoplasm of breast: Secondary | ICD-10-CM

## 2016-02-09 ENCOUNTER — Ambulatory Visit
Admission: RE | Admit: 2016-02-09 | Discharge: 2016-02-09 | Disposition: A | Discharge status: Home / Self Care | Payer: 59 | Source: Ambulatory Visit

## 2016-02-09 DIAGNOSIS — Z1231 Encounter for screening mammogram for malignant neoplasm of breast: Secondary | ICD-10-CM

## 2017-01-09 ENCOUNTER — Other Ambulatory Visit: Payer: Self-pay | Admitting: Internal Medicine

## 2017-01-09 DIAGNOSIS — Z1231 Encounter for screening mammogram for malignant neoplasm of breast: Secondary | ICD-10-CM

## 2017-02-21 ENCOUNTER — Ambulatory Visit
Admission: RE | Admit: 2017-02-21 | Discharge: 2017-02-21 | Disposition: A | Discharge status: Home / Self Care | Payer: 59 | Source: Ambulatory Visit | Attending: Internal Medicine | Admitting: Internal Medicine

## 2017-02-21 DIAGNOSIS — Z1231 Encounter for screening mammogram for malignant neoplasm of breast: Secondary | ICD-10-CM

## 2017-09-25 ENCOUNTER — Ambulatory Visit: Payer: 59 | Admitting: Sports Medicine

## 2017-10-19 ENCOUNTER — Ambulatory Visit: Payer: 59 | Admitting: Sports Medicine

## 2017-10-19 ENCOUNTER — Encounter: Payer: Self-pay | Admitting: Sports Medicine

## 2017-10-19 DIAGNOSIS — G8929 Other chronic pain: Secondary | ICD-10-CM | POA: Diagnosis not present

## 2017-10-19 DIAGNOSIS — M25562 Pain in left knee: Secondary | ICD-10-CM

## 2017-10-19 DIAGNOSIS — M25561 Pain in right knee: Secondary | ICD-10-CM | POA: Diagnosis not present

## 2017-10-19 NOTE — Progress Notes (Signed)
   Subjective:    Patient ID: Paula Chase, female    DOB: 12/10/1960, 56 y.o.   MRN: [id number]  HPI 56 yo female presents with bilateral knee pain worse in the left knee that began in June.  At that time she began an exercise program called boot camp.  The exercises include jumping jacks, lunges etc.  Her pain is described as a soreness that is worse with movement and at the end of the day.  She denies any radicular symptoms, her knees do not give out or buckle, she denies any weakness. She only uses one extra strength tylenol at night with some success and has tried icing her knees with no success.    Past Hx No significant arthritis Cervical disc disease  Review of Systems As per hpi    Objective:   Physical Exam  Constitutional: She appears well-developed and well-nourished.  HENT:  Head: Normocephalic and atraumatic.  Musculoskeletal:  No crepitus appreciated with flexion and extension of bilateral knees.  Normal range of motion of bilateral knees.  Mcmurray, lockman, drawer negative in bilateral knees. Mild pain and crepitus with manipulation of left patella. Pt with mild weakness in hip abduction and adduction bilaterally.  Pt with very strong hip flexors, hamstrings, quads bilaterally.    Skin: Skin is warm and dry. No rash noted. No erythema.  Vitals reviewed.         Assessment & Plan:  1. Patellofemoral syndrome: Pts exam with little to no arthritis in knees at this time.  Pain most likely due to patellofemoral syndrome.    -strengthening exercises for hip abduction adduction provided to pt -continue to exercise, can use tylenol during the day as well

## 2017-10-19 NOTE — Assessment & Plan Note (Signed)
Good exam  I suspect she should respond well to hip series  Icing prn  Advised to work this for 6 weeks and if no response will reassess

## 2017-11-03 ENCOUNTER — Other Ambulatory Visit: Payer: Self-pay | Admitting: Internal Medicine

## 2017-11-03 ENCOUNTER — Ambulatory Visit
Admission: RE | Admit: 2017-11-03 | Discharge: 2017-11-03 | Disposition: A | Discharge status: Home / Self Care | Payer: 59 | Source: Ambulatory Visit | Attending: Internal Medicine | Admitting: Internal Medicine

## 2017-11-03 DIAGNOSIS — M25511 Pain in right shoulder: Secondary | ICD-10-CM

## 2018-01-29 ENCOUNTER — Other Ambulatory Visit: Payer: Self-pay | Admitting: Internal Medicine

## 2018-01-29 DIAGNOSIS — Z1231 Encounter for screening mammogram for malignant neoplasm of breast: Secondary | ICD-10-CM

## 2018-03-15 ENCOUNTER — Ambulatory Visit
Admission: RE | Admit: 2018-03-15 | Discharge: 2018-03-15 | Disposition: A | Discharge status: Home / Self Care | Payer: 59 | Source: Ambulatory Visit | Attending: Internal Medicine | Admitting: Internal Medicine

## 2018-03-15 DIAGNOSIS — Z1231 Encounter for screening mammogram for malignant neoplasm of breast: Secondary | ICD-10-CM

## 2019-04-26 ENCOUNTER — Other Ambulatory Visit: Payer: Self-pay | Admitting: Internal Medicine

## 2019-04-26 DIAGNOSIS — Z1231 Encounter for screening mammogram for malignant neoplasm of breast: Secondary | ICD-10-CM

## 2019-05-27 ENCOUNTER — Ambulatory Visit
Admission: RE | Admit: 2019-05-27 | Discharge: 2019-05-27 | Disposition: A | Discharge status: Home / Self Care | Payer: Managed Care, Other (non HMO) | Source: Ambulatory Visit | Attending: Internal Medicine | Admitting: Internal Medicine

## 2019-05-27 ENCOUNTER — Other Ambulatory Visit: Payer: Self-pay

## 2019-05-27 DIAGNOSIS — Z1231 Encounter for screening mammogram for malignant neoplasm of breast: Secondary | ICD-10-CM

## 2019-07-10 ENCOUNTER — Other Ambulatory Visit: Payer: Self-pay | Admitting: Internal Medicine

## 2019-07-10 DIAGNOSIS — R221 Localized swelling, mass and lump, neck: Secondary | ICD-10-CM

## 2019-07-10 DIAGNOSIS — E041 Nontoxic single thyroid nodule: Secondary | ICD-10-CM

## 2019-07-11 ENCOUNTER — Ambulatory Visit
Admission: RE | Admit: 2019-07-11 | Discharge: 2019-07-11 | Disposition: A | Discharge status: Home / Self Care | Payer: Managed Care, Other (non HMO) | Source: Ambulatory Visit | Attending: Internal Medicine | Admitting: Internal Medicine

## 2019-07-11 DIAGNOSIS — E041 Nontoxic single thyroid nodule: Secondary | ICD-10-CM

## 2019-07-11 DIAGNOSIS — R221 Localized swelling, mass and lump, neck: Secondary | ICD-10-CM

## 2020-04-27 ENCOUNTER — Other Ambulatory Visit: Payer: Self-pay | Admitting: Internal Medicine

## 2020-04-27 DIAGNOSIS — Z1231 Encounter for screening mammogram for malignant neoplasm of breast: Secondary | ICD-10-CM

## 2020-05-27 ENCOUNTER — Other Ambulatory Visit: Payer: Self-pay

## 2020-05-27 ENCOUNTER — Ambulatory Visit
Admission: RE | Admit: 2020-05-27 | Discharge: 2020-05-27 | Disposition: A | Discharge status: Home / Self Care | Payer: No Typology Code available for payment source | Source: Ambulatory Visit | Attending: Internal Medicine | Admitting: Internal Medicine

## 2020-05-27 DIAGNOSIS — Z1231 Encounter for screening mammogram for malignant neoplasm of breast: Secondary | ICD-10-CM

## 2021-04-20 ENCOUNTER — Other Ambulatory Visit: Payer: Self-pay | Admitting: Internal Medicine

## 2021-04-20 DIAGNOSIS — Z1231 Encounter for screening mammogram for malignant neoplasm of breast: Secondary | ICD-10-CM

## 2021-06-01 ENCOUNTER — Other Ambulatory Visit: Payer: Self-pay

## 2021-06-01 ENCOUNTER — Ambulatory Visit
Admission: RE | Admit: 2021-06-01 | Discharge: 2021-06-01 | Disposition: A | Discharge status: Home / Self Care | Payer: No Typology Code available for payment source | Source: Ambulatory Visit | Attending: Internal Medicine | Admitting: Internal Medicine

## 2021-06-01 DIAGNOSIS — Z1231 Encounter for screening mammogram for malignant neoplasm of breast: Secondary | ICD-10-CM

## 2022-04-28 ENCOUNTER — Other Ambulatory Visit: Payer: Self-pay | Admitting: Internal Medicine

## 2022-04-28 DIAGNOSIS — Z1231 Encounter for screening mammogram for malignant neoplasm of breast: Secondary | ICD-10-CM

## 2022-06-16 ENCOUNTER — Ambulatory Visit
Admission: RE | Admit: 2022-06-16 | Discharge: 2022-06-16 | Disposition: A | Discharge status: Home / Self Care | Payer: BC Managed Care – PPO | Source: Ambulatory Visit | Attending: Internal Medicine | Admitting: Internal Medicine

## 2022-06-16 DIAGNOSIS — Z1231 Encounter for screening mammogram for malignant neoplasm of breast: Secondary | ICD-10-CM

## 2023-01-21 IMAGING — MG MM DIGITAL SCREENING BILAT W/ TOMO AND CAD
8 series · 9 of 24 positions shown · non-contrast
Comparison: Previous exam(s).

CLINICAL DATA: Screening.

EXAM:
DIGITAL SCREENING BILATERAL MAMMOGRAM WITH TOMOSYNTHESIS AND CAD
TECHNIQUE: Bilateral screening digital craniocaudal and mediolateral oblique
mammograms were obtained. Bilateral screening digital breast
tomosynthesis was performed. The images were evaluated with
computer-aided detection.

[L MLO synth-2D]
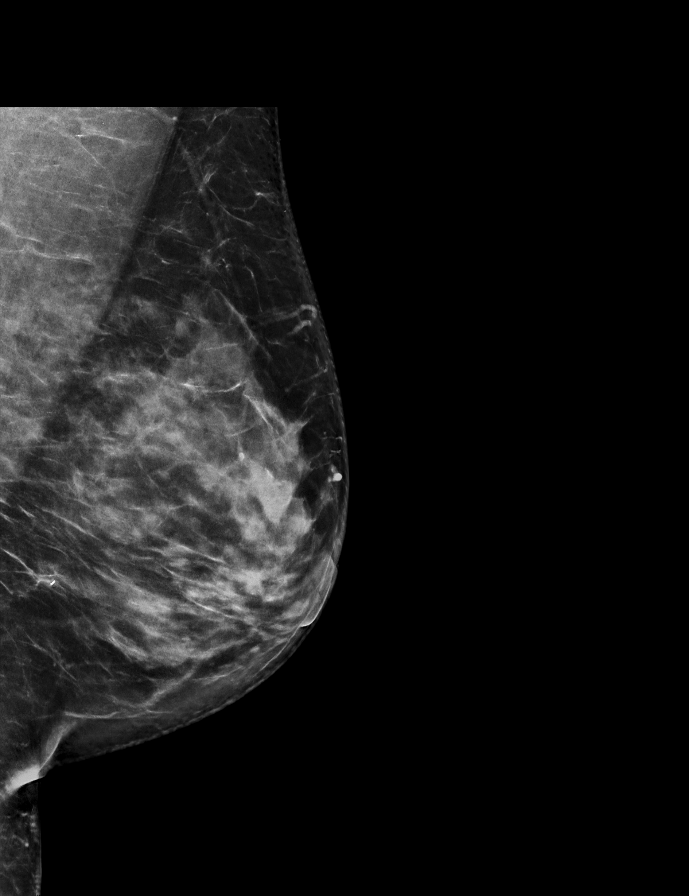

[R MLO synth-2D]
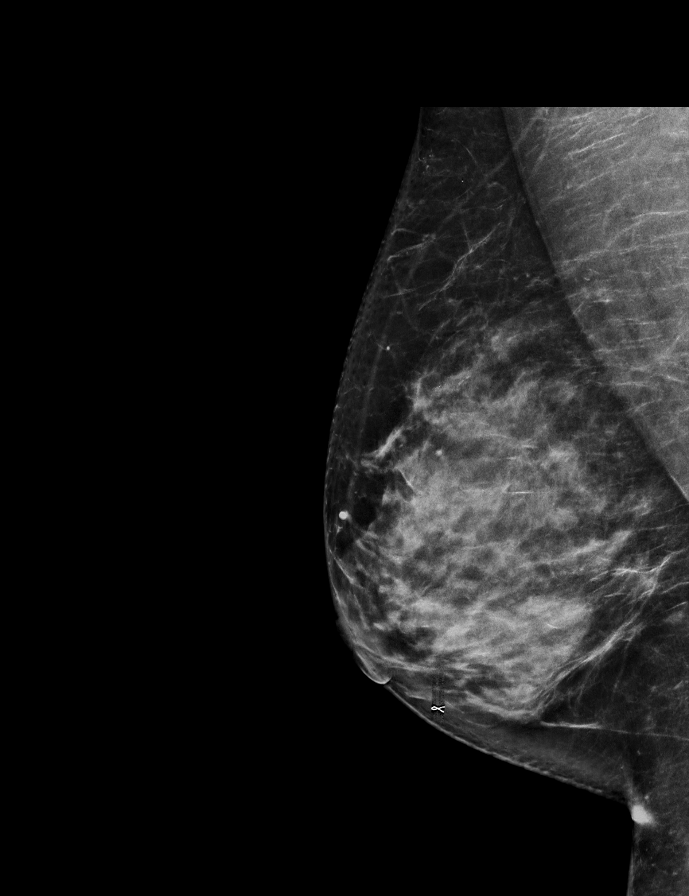

[L CC synth-2D]
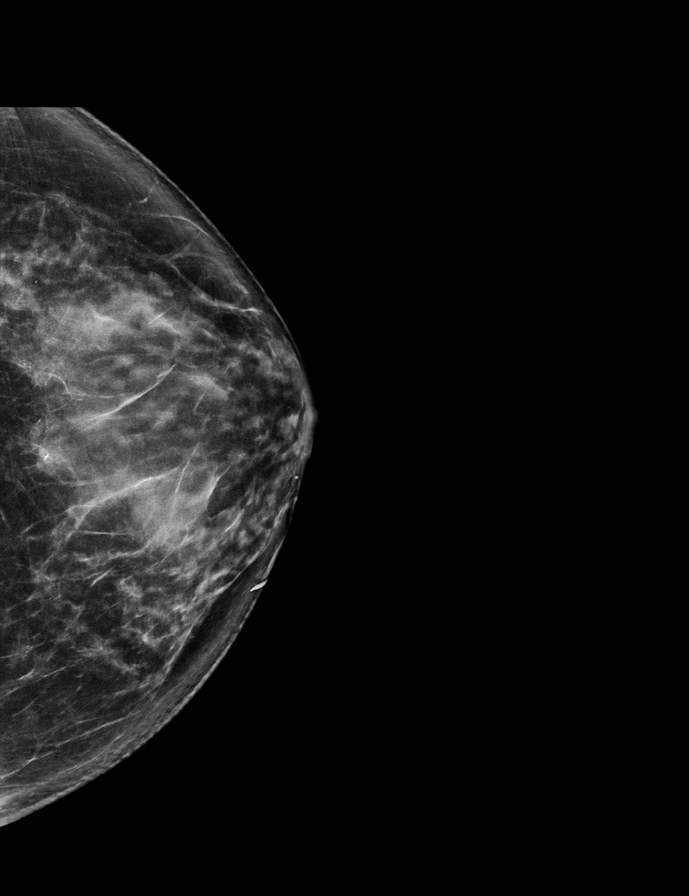

[R CC synth-2D]
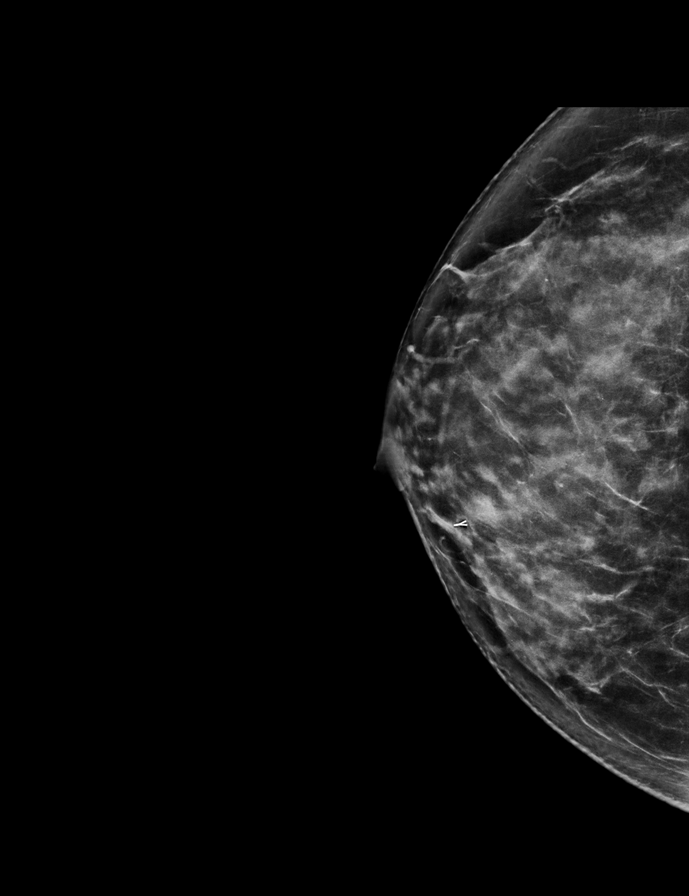

[R MLO tomo · 2 of 82 frames shown]
[frame 27/82]
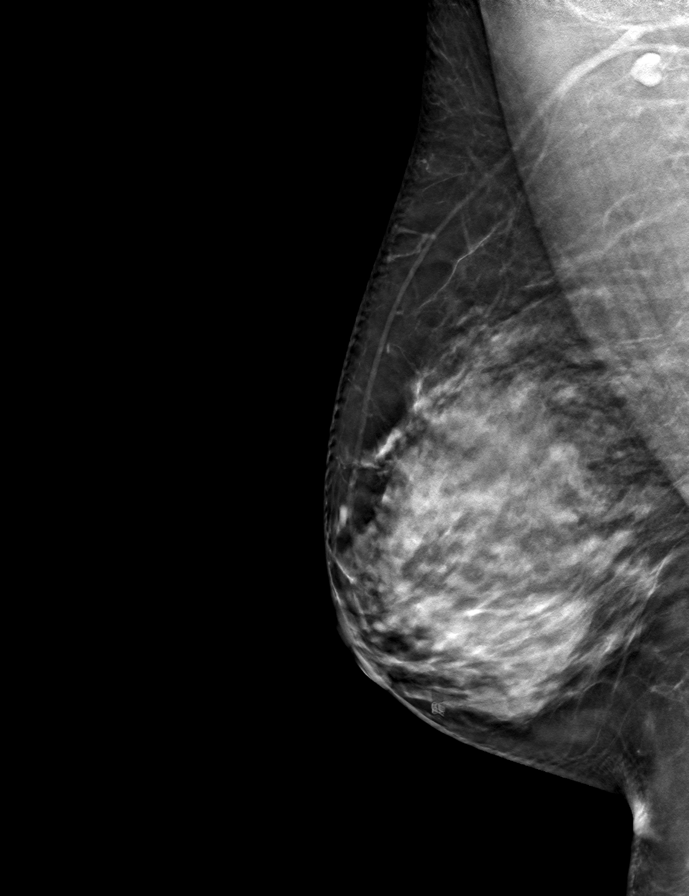
[frame 41/82]
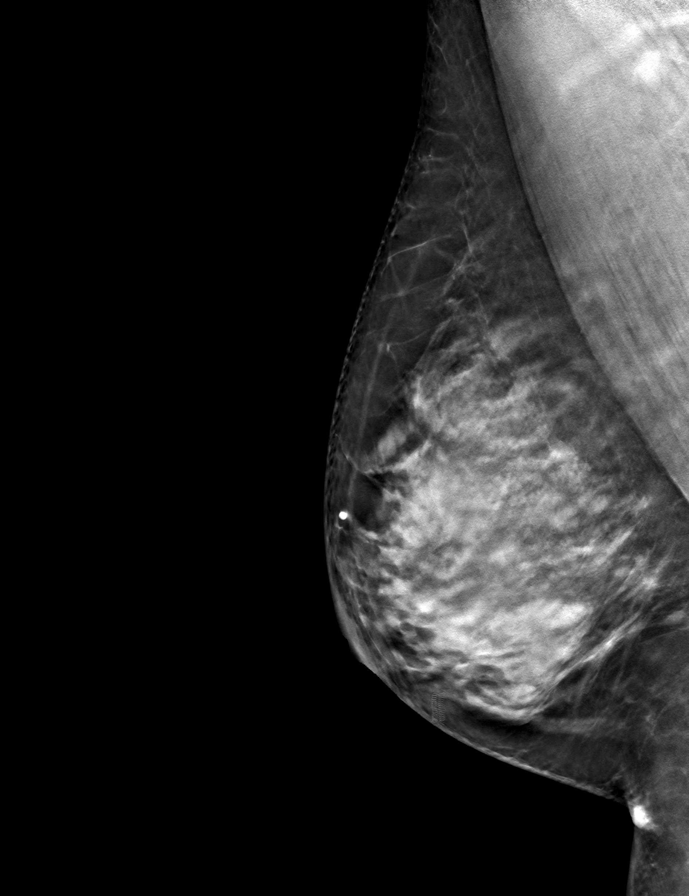

[L MLO tomo · tomo slice 43/85.0]
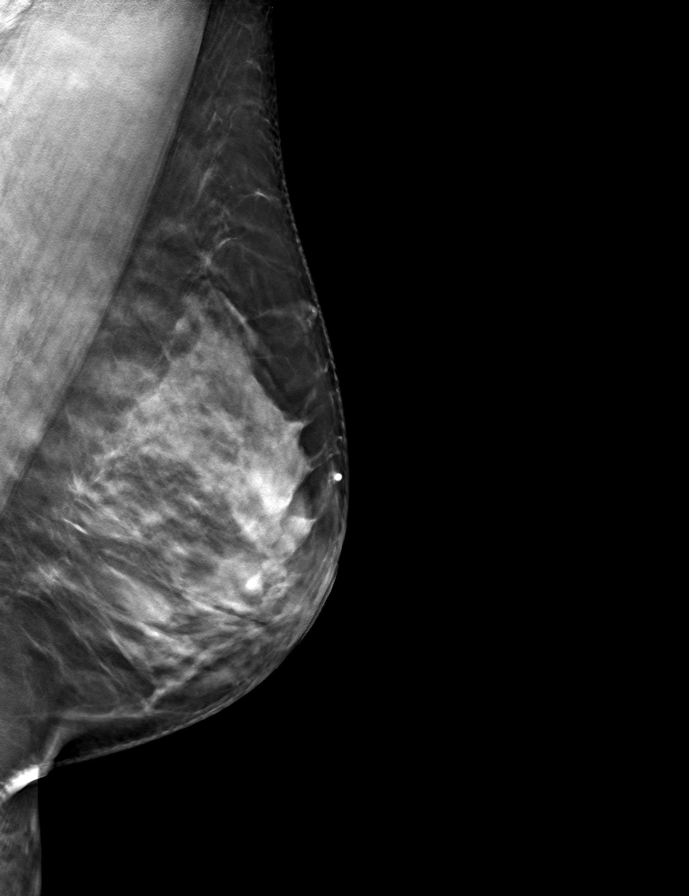

[L CC tomo · tomo slice 39/77.0]
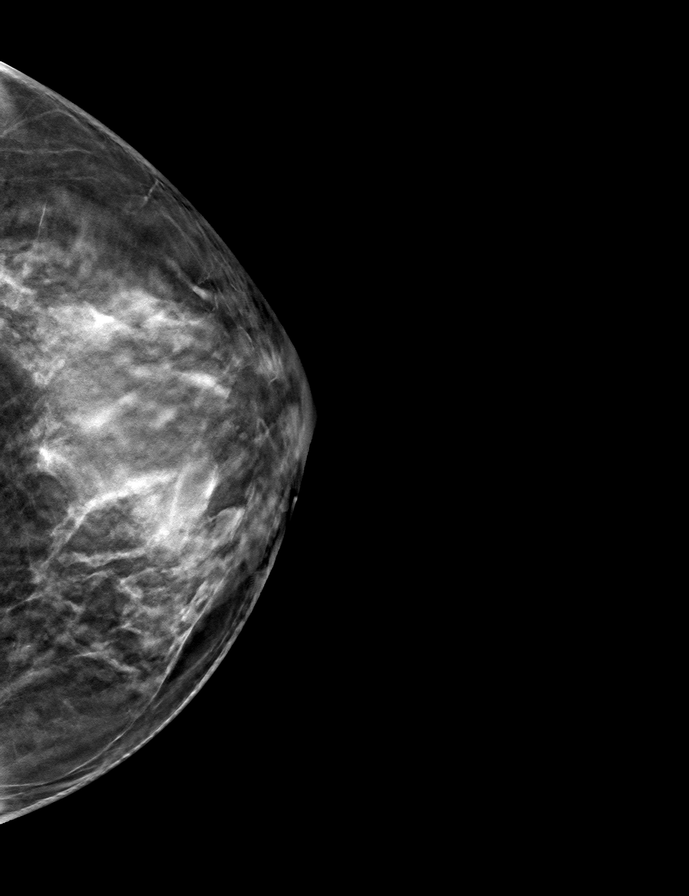

[R CC tomo · tomo slice 37/73.0]
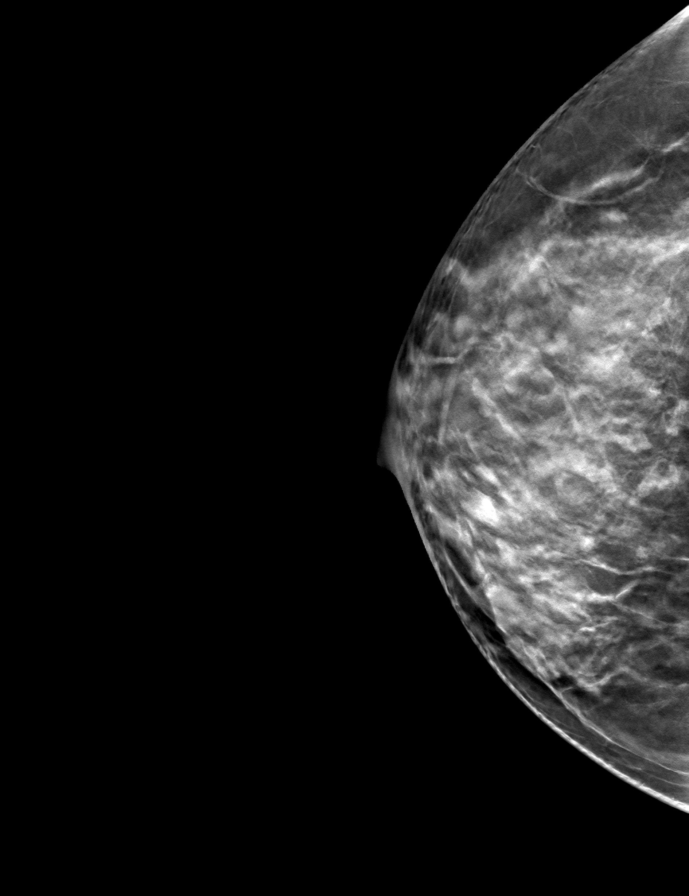

[9 of 24 positions shown; findings below may reference images not displayed]

ACR Breast Density Category c: The breast tissue is heterogeneously
dense, which may obscure small masses.
FINDINGS: There are no findings suspicious for malignancy.
IMPRESSION: No mammographic evidence of malignancy. A result letter of this
screening mammogram will be mailed directly to the patient.

RECOMMENDATION:
Screening mammogram in one year. (Code:Q3-W-BC3)

BI-RADS CATEGORY  1: Negative.

## 2023-06-09 ENCOUNTER — Other Ambulatory Visit: Payer: Self-pay | Admitting: Internal Medicine

## 2023-06-09 DIAGNOSIS — Z1231 Encounter for screening mammogram for malignant neoplasm of breast: Secondary | ICD-10-CM

## 2023-06-20 ENCOUNTER — Ambulatory Visit
Admission: RE | Admit: 2023-06-20 | Discharge: 2023-06-20 | Disposition: A | Discharge status: Home / Self Care | Payer: BC Managed Care – PPO | Source: Ambulatory Visit | Attending: Internal Medicine | Admitting: Internal Medicine

## 2023-06-20 DIAGNOSIS — Z1231 Encounter for screening mammogram for malignant neoplasm of breast: Secondary | ICD-10-CM

## 2024-06-20 ENCOUNTER — Other Ambulatory Visit: Payer: Self-pay | Admitting: Internal Medicine

## 2024-06-20 DIAGNOSIS — Z1231 Encounter for screening mammogram for malignant neoplasm of breast: Secondary | ICD-10-CM

## 2024-07-05 ENCOUNTER — Ambulatory Visit
Admission: RE | Admit: 2024-07-05 | Discharge: 2024-07-05 | Disposition: A | Discharge status: Home / Self Care | Payer: Self-pay | Source: Ambulatory Visit | Attending: Internal Medicine | Admitting: Internal Medicine

## 2024-07-05 DIAGNOSIS — Z1231 Encounter for screening mammogram for malignant neoplasm of breast: Secondary | ICD-10-CM
